# Patient Record
Sex: Female | Born: 1960 | Race: White | Hispanic: No | Marital: Married | Smoking: Former smoker
Health system: Southern US, Community
[De-identification: ages and names within clinical notes are randomized; demographics above are authoritative.]

## PROBLEM LIST (undated history)

## (undated) DIAGNOSIS — Z940 Kidney transplant status: Secondary | ICD-10-CM

## (undated) DIAGNOSIS — I10 Essential (primary) hypertension: Secondary | ICD-10-CM

## (undated) DIAGNOSIS — I420 Dilated cardiomyopathy: Secondary | ICD-10-CM

## (undated) DIAGNOSIS — E7800 Pure hypercholesterolemia, unspecified: Secondary | ICD-10-CM

## (undated) DIAGNOSIS — F419 Anxiety disorder, unspecified: Secondary | ICD-10-CM

## (undated) DIAGNOSIS — I429 Cardiomyopathy, unspecified: Secondary | ICD-10-CM

## (undated) DIAGNOSIS — J45991 Cough variant asthma: Secondary | ICD-10-CM

## (undated) DIAGNOSIS — N189 Chronic kidney disease, unspecified: Secondary | ICD-10-CM

## (undated) DIAGNOSIS — D631 Anemia in chronic kidney disease: Secondary | ICD-10-CM

## (undated) DIAGNOSIS — F329 Major depressive disorder, single episode, unspecified: Secondary | ICD-10-CM

## (undated) DIAGNOSIS — J309 Allergic rhinitis, unspecified: Secondary | ICD-10-CM

## (undated) DIAGNOSIS — J329 Chronic sinusitis, unspecified: Secondary | ICD-10-CM

## (undated) DIAGNOSIS — L719 Rosacea, unspecified: Secondary | ICD-10-CM

## (undated) DIAGNOSIS — D849 Immunodeficiency, unspecified: Secondary | ICD-10-CM

## (undated) DIAGNOSIS — N943 Premenstrual tension syndrome: Secondary | ICD-10-CM

## (undated) DIAGNOSIS — N289 Disorder of kidney and ureter, unspecified: Secondary | ICD-10-CM

## (undated) DIAGNOSIS — F32A Depression, unspecified: Secondary | ICD-10-CM

## (undated) HISTORY — DX: Allergic rhinitis, unspecified: J30.9

## (undated) HISTORY — DX: Anemia in chronic kidney disease: D63.1

## (undated) HISTORY — DX: Dilated cardiomyopathy: I42.0

## (undated) HISTORY — PX: KIDNEY TRANSPLANT: SHX239

## (undated) HISTORY — DX: Cough variant asthma: J45.991

## (undated) HISTORY — DX: Rosacea, unspecified: L71.9

## (undated) HISTORY — PX: APPENDECTOMY: SHX54

## (undated) HISTORY — DX: Cardiomyopathy, unspecified: I42.9

## (undated) HISTORY — DX: Premenstrual tension syndrome: N94.3

## (undated) HISTORY — DX: Chronic sinusitis, unspecified: J32.9

## (undated) HISTORY — DX: Kidney transplant status: Z94.0

## (undated) HISTORY — DX: Immunodeficiency, unspecified: D84.9

## (undated) HISTORY — DX: Chronic kidney disease, unspecified: N18.9

---

## 1997-09-20 ENCOUNTER — Other Ambulatory Visit: Admission: RE | Admit: 1997-09-20 | Discharge: 1997-09-20 | Payer: Self-pay | Admitting: Obstetrics and Gynecology

## 1998-05-21 ENCOUNTER — Encounter: Admission: RE | Admit: 1998-05-21 | Discharge: 1998-05-21 | Payer: Self-pay | Admitting: *Deleted

## 1998-07-20 HISTORY — PX: OTHER SURGICAL HISTORY: SHX169

## 1998-08-02 ENCOUNTER — Ambulatory Visit (HOSPITAL_COMMUNITY): Admission: RE | Admit: 1998-08-02 | Discharge: 1998-08-02 | Payer: Self-pay | Admitting: Obstetrics and Gynecology

## 1998-10-07 ENCOUNTER — Other Ambulatory Visit: Admission: RE | Admit: 1998-10-07 | Discharge: 1998-10-07 | Payer: Self-pay | Admitting: Obstetrics and Gynecology

## 1999-03-20 ENCOUNTER — Encounter: Payer: Self-pay | Admitting: Family Medicine

## 1999-03-20 ENCOUNTER — Encounter: Admission: RE | Admit: 1999-03-20 | Discharge: 1999-03-20 | Payer: Self-pay | Admitting: Family Medicine

## 1999-10-24 ENCOUNTER — Other Ambulatory Visit: Admission: RE | Admit: 1999-10-24 | Discharge: 1999-10-24 | Payer: Self-pay | Admitting: Obstetrics and Gynecology

## 2000-11-05 ENCOUNTER — Other Ambulatory Visit: Admission: RE | Admit: 2000-11-05 | Discharge: 2000-11-05 | Payer: Self-pay | Admitting: Obstetrics and Gynecology

## 2001-12-13 ENCOUNTER — Other Ambulatory Visit: Admission: RE | Admit: 2001-12-13 | Discharge: 2001-12-13 | Payer: Self-pay | Admitting: Obstetrics and Gynecology

## 2002-12-22 ENCOUNTER — Other Ambulatory Visit: Admission: RE | Admit: 2002-12-22 | Discharge: 2002-12-22 | Payer: Self-pay | Admitting: Obstetrics and Gynecology

## 2002-12-26 ENCOUNTER — Ambulatory Visit (HOSPITAL_COMMUNITY): Admission: RE | Admit: 2002-12-26 | Discharge: 2002-12-26 | Payer: Self-pay | Admitting: Obstetrics and Gynecology

## 2002-12-26 ENCOUNTER — Encounter: Payer: Self-pay | Admitting: Obstetrics and Gynecology

## 2003-12-28 ENCOUNTER — Ambulatory Visit (HOSPITAL_COMMUNITY): Admission: RE | Admit: 2003-12-28 | Discharge: 2003-12-28 | Payer: Self-pay | Admitting: Obstetrics and Gynecology

## 2005-01-23 ENCOUNTER — Ambulatory Visit (HOSPITAL_COMMUNITY): Admission: RE | Admit: 2005-01-23 | Discharge: 2005-01-23 | Payer: Self-pay | Admitting: Obstetrics and Gynecology

## 2006-01-25 ENCOUNTER — Ambulatory Visit (HOSPITAL_COMMUNITY): Admission: RE | Admit: 2006-01-25 | Discharge: 2006-01-25 | Payer: Self-pay | Admitting: Obstetrics and Gynecology

## 2006-08-08 ENCOUNTER — Inpatient Hospital Stay (HOSPITAL_COMMUNITY): Admission: EM | Admit: 2006-08-08 | Discharge: 2006-08-12 | Payer: Self-pay | Admitting: *Deleted

## 2006-09-18 ENCOUNTER — Encounter: Admission: RE | Admit: 2006-09-18 | Discharge: 2006-09-18 | Payer: Self-pay | Admitting: Gastroenterology

## 2006-10-28 ENCOUNTER — Ambulatory Visit (HOSPITAL_COMMUNITY): Admission: RE | Admit: 2006-10-28 | Discharge: 2006-10-28 | Payer: Self-pay | Admitting: Nephrology

## 2006-10-28 ENCOUNTER — Inpatient Hospital Stay (HOSPITAL_COMMUNITY): Admission: EM | Admit: 2006-10-28 | Discharge: 2006-10-31 | Payer: Self-pay | Admitting: Emergency Medicine

## 2006-10-29 ENCOUNTER — Encounter (INDEPENDENT_AMBULATORY_CARE_PROVIDER_SITE_OTHER): Payer: Self-pay | Admitting: Nephrology

## 2006-11-26 ENCOUNTER — Encounter (HOSPITAL_COMMUNITY): Admission: RE | Admit: 2006-11-26 | Discharge: 2007-02-24 | Payer: Self-pay | Admitting: Nephrology

## 2007-01-28 ENCOUNTER — Ambulatory Visit (HOSPITAL_COMMUNITY): Admission: RE | Admit: 2007-01-28 | Discharge: 2007-01-28 | Payer: Self-pay | Admitting: Obstetrics and Gynecology

## 2007-03-09 ENCOUNTER — Encounter (HOSPITAL_COMMUNITY): Admission: RE | Admit: 2007-03-09 | Discharge: 2007-06-07 | Payer: Self-pay | Admitting: Nephrology

## 2007-03-16 ENCOUNTER — Encounter: Admission: RE | Admit: 2007-03-16 | Discharge: 2007-03-16 | Payer: Self-pay | Admitting: Family Medicine

## 2007-04-09 ENCOUNTER — Emergency Department (HOSPITAL_COMMUNITY): Admission: EM | Admit: 2007-04-09 | Discharge: 2007-04-09 | Payer: Self-pay | Admitting: Emergency Medicine

## 2007-06-14 ENCOUNTER — Encounter (HOSPITAL_COMMUNITY): Admission: RE | Admit: 2007-06-14 | Discharge: 2007-09-12 | Payer: Self-pay | Admitting: Nephrology

## 2007-10-04 ENCOUNTER — Encounter (HOSPITAL_COMMUNITY): Admission: RE | Admit: 2007-10-04 | Discharge: 2008-01-02 | Payer: Self-pay | Admitting: Nephrology

## 2008-01-31 ENCOUNTER — Ambulatory Visit (HOSPITAL_COMMUNITY): Admission: RE | Admit: 2008-01-31 | Discharge: 2008-01-31 | Payer: Self-pay | Admitting: Obstetrics and Gynecology

## 2008-02-02 ENCOUNTER — Encounter (HOSPITAL_COMMUNITY): Admission: RE | Admit: 2008-02-02 | Discharge: 2008-04-27 | Payer: Self-pay | Admitting: Nephrology

## 2008-06-20 ENCOUNTER — Encounter (HOSPITAL_COMMUNITY): Admission: RE | Admit: 2008-06-20 | Discharge: 2008-09-18 | Payer: Self-pay | Admitting: Nephrology

## 2009-01-31 ENCOUNTER — Ambulatory Visit (HOSPITAL_COMMUNITY): Admission: RE | Admit: 2009-01-31 | Discharge: 2009-01-31 | Payer: Self-pay | Admitting: Obstetrics and Gynecology

## 2009-05-16 ENCOUNTER — Encounter: Admission: RE | Admit: 2009-05-16 | Discharge: 2009-05-16 | Payer: Self-pay | Admitting: Nephrology

## 2009-05-23 ENCOUNTER — Inpatient Hospital Stay (HOSPITAL_COMMUNITY): Admission: AD | Admit: 2009-05-23 | Discharge: 2009-05-28 | Payer: Self-pay | Admitting: Nephrology

## 2009-05-27 ENCOUNTER — Encounter (INDEPENDENT_AMBULATORY_CARE_PROVIDER_SITE_OTHER): Payer: Self-pay | Admitting: Nephrology

## 2009-08-02 ENCOUNTER — Observation Stay (HOSPITAL_COMMUNITY): Admission: RE | Admit: 2009-08-02 | Discharge: 2009-08-03 | Payer: Self-pay | Admitting: General Surgery

## 2009-08-19 ENCOUNTER — Inpatient Hospital Stay (HOSPITAL_COMMUNITY): Admission: EM | Admit: 2009-08-19 | Discharge: 2009-08-20 | Payer: Self-pay | Admitting: Emergency Medicine

## 2009-09-03 ENCOUNTER — Ambulatory Visit: Payer: Self-pay | Admitting: Internal Medicine

## 2009-09-03 ENCOUNTER — Ambulatory Visit: Payer: Self-pay | Admitting: Cardiology

## 2009-09-03 ENCOUNTER — Inpatient Hospital Stay (HOSPITAL_COMMUNITY): Admission: EM | Admit: 2009-09-03 | Discharge: 2009-09-12 | Payer: Self-pay | Admitting: Emergency Medicine

## 2009-09-05 ENCOUNTER — Encounter (INDEPENDENT_AMBULATORY_CARE_PROVIDER_SITE_OTHER): Payer: Self-pay | Admitting: Internal Medicine

## 2009-10-04 ENCOUNTER — Ambulatory Visit (HOSPITAL_COMMUNITY): Admission: RE | Admit: 2009-10-04 | Discharge: 2009-10-04 | Payer: Self-pay | Admitting: Cardiology

## 2009-10-25 ENCOUNTER — Ambulatory Visit (HOSPITAL_COMMUNITY): Admission: RE | Admit: 2009-10-25 | Discharge: 2009-10-25 | Payer: Self-pay | Admitting: Nephrology

## 2009-10-31 ENCOUNTER — Encounter: Admission: RE | Admit: 2009-10-31 | Discharge: 2009-10-31 | Payer: Self-pay | Admitting: Nephrology

## 2009-11-01 ENCOUNTER — Ambulatory Visit (HOSPITAL_COMMUNITY): Admission: RE | Admit: 2009-11-01 | Discharge: 2009-11-01 | Payer: Self-pay | Admitting: Nephrology

## 2009-11-04 ENCOUNTER — Ambulatory Visit (HOSPITAL_COMMUNITY): Admission: RE | Admit: 2009-11-04 | Discharge: 2009-11-05 | Payer: Self-pay | Admitting: General Surgery

## 2009-11-18 DEATH — deceased

## 2009-12-11 ENCOUNTER — Ambulatory Visit (HOSPITAL_COMMUNITY): Admission: RE | Admit: 2009-12-11 | Discharge: 2009-12-11 | Payer: Self-pay | Admitting: Nephrology

## 2010-01-30 ENCOUNTER — Ambulatory Visit (HOSPITAL_COMMUNITY)
Admission: RE | Admit: 2010-01-30 | Discharge: 2010-01-30 | Payer: Self-pay | Source: Home / Self Care | Admitting: Obstetrics and Gynecology

## 2010-04-26 ENCOUNTER — Emergency Department (HOSPITAL_COMMUNITY)
Admission: EM | Admit: 2010-04-26 | Discharge: 2010-04-26 | Payer: Self-pay | Source: Home / Self Care | Admitting: Emergency Medicine

## 2010-05-05 LAB — POCT I-STAT, CHEM 8
BUN: 79 mg/dL — ABNORMAL HIGH (ref 6–23)
Calcium, Ion: 1.05 mmol/L — ABNORMAL LOW (ref 1.12–1.32)
Chloride: 108 mEq/L (ref 96–112)
Creatinine, Ser: 11.9 mg/dL — ABNORMAL HIGH (ref 0.4–1.2)
Glucose, Bld: 74 mg/dL (ref 70–99)
HCT: 38 % (ref 36.0–46.0)
Hemoglobin: 12.9 g/dL (ref 12.0–15.0)
Potassium: 4.9 mEq/L (ref 3.5–5.1)
Sodium: 136 mEq/L (ref 135–145)
TCO2: 25 mmol/L (ref 0–100)

## 2010-05-05 LAB — POTASSIUM: Potassium: 5 mEq/L (ref 3.5–5.1)

## 2010-06-24 ENCOUNTER — Encounter (HOSPITAL_COMMUNITY)
Admission: RE | Admit: 2010-06-24 | Discharge: 2010-06-24 | Disposition: A | Discharge status: Home / Self Care | Payer: 59 | Source: Ambulatory Visit | Attending: Orthopedic Surgery | Admitting: Orthopedic Surgery

## 2010-06-24 LAB — CBC
HCT: 38.6 % (ref 36.0–46.0)
Hemoglobin: 11.8 g/dL — ABNORMAL LOW (ref 12.0–15.0)
MCV: 95.1 fL (ref 78.0–100.0)
Platelets: 436 10*3/uL — ABNORMAL HIGH (ref 150–400)

## 2010-06-24 LAB — BASIC METABOLIC PANEL
BUN: 61 mg/dL — ABNORMAL HIGH (ref 6–23)
Calcium: 9.6 mg/dL (ref 8.4–10.5)
Creatinine, Ser: 12.05 mg/dL — ABNORMAL HIGH (ref 0.4–1.2)
GFR calc Af Amer: 4 mL/min — ABNORMAL LOW (ref >60)
Sodium: 140 mEq/L (ref 135–145)

## 2010-06-24 LAB — PROTIME-INR: Prothrombin Time: 11.8 seconds (ref 11.6–15.2)

## 2010-06-24 LAB — SURGICAL PCR SCREEN: MRSA, PCR: NEGATIVE

## 2010-06-26 ENCOUNTER — Other Ambulatory Visit: Payer: Self-pay | Admitting: Orthopedic Surgery

## 2010-06-26 ENCOUNTER — Ambulatory Visit (HOSPITAL_COMMUNITY)
Admission: RE | Admit: 2010-06-26 | Discharge: 2010-06-26 | Disposition: A | Discharge status: Home / Self Care | Payer: 59 | Source: Ambulatory Visit | Attending: Orthopedic Surgery | Admitting: Orthopedic Surgery

## 2010-06-26 DIAGNOSIS — Z01818 Encounter for other preprocedural examination: Secondary | ICD-10-CM | POA: Insufficient documentation

## 2010-06-26 DIAGNOSIS — R52 Pain, unspecified: Secondary | ICD-10-CM

## 2010-06-26 DIAGNOSIS — Z01812 Encounter for preprocedural laboratory examination: Secondary | ICD-10-CM | POA: Insufficient documentation

## 2010-06-26 DIAGNOSIS — G5600 Carpal tunnel syndrome, unspecified upper limb: Secondary | ICD-10-CM | POA: Insufficient documentation

## 2010-06-26 LAB — POCT I-STAT 4, (NA,K, GLUC, HGB,HCT)
Glucose, Bld: 84 mg/dL (ref 70–99)
HCT: 37 % (ref 36.0–46.0)
Potassium: 5 mEq/L (ref 3.5–5.1)

## 2010-07-05 LAB — CBC
HCT: 35.6 % — ABNORMAL LOW (ref 36.0–46.0)
HCT: 44.2 % (ref 36.0–46.0)
Hemoglobin: 11.8 g/dL — ABNORMAL LOW (ref 12.0–15.0)
MCH: 31.1 pg (ref 26.0–34.0)
MCH: 31.3 pg (ref 26.0–34.0)
MCH: 31.3 pg (ref 26.0–34.0)
MCHC: 33 g/dL (ref 30.0–36.0)
MCV: 94.6 fL (ref 78.0–100.0)
MCV: 94.9 fL (ref 78.0–100.0)
MCV: 95.1 fL (ref 78.0–100.0)
Platelets: 302 10*3/uL (ref 150–400)
Platelets: 354 10*3/uL (ref 150–400)
RBC: 4.66 MIL/uL (ref 3.87–5.11)
RDW: 16.7 % — ABNORMAL HIGH (ref 11.5–15.5)

## 2010-07-05 LAB — RENAL FUNCTION PANEL
BUN: 62 mg/dL — ABNORMAL HIGH (ref 6–23)
CO2: 18 mEq/L — ABNORMAL LOW (ref 19–32)
Chloride: 108 mEq/L (ref 96–112)
Creatinine, Ser: 10.84 mg/dL — ABNORMAL HIGH (ref 0.4–1.2)
Glucose, Bld: 58 mg/dL — ABNORMAL LOW (ref 70–99)

## 2010-07-05 LAB — BASIC METABOLIC PANEL
BUN: 65 mg/dL — ABNORMAL HIGH (ref 6–23)
CO2: 23 mEq/L (ref 19–32)
Chloride: 98 mEq/L (ref 96–112)
Creatinine, Ser: 11.79 mg/dL — ABNORMAL HIGH (ref 0.4–1.2)
GFR calc Af Amer: 3 mL/min — ABNORMAL LOW (ref >60)
GFR calc Af Amer: 4 mL/min — ABNORMAL LOW (ref >60)
GFR calc non Af Amer: 3 mL/min — ABNORMAL LOW (ref >60)

## 2010-07-05 LAB — SURGICAL PCR SCREEN: Staphylococcus aureus: NEGATIVE

## 2010-07-05 LAB — DIFFERENTIAL
Eosinophils Absolute: 0.4 10*3/uL (ref 0.0–0.7)
Eosinophils Relative: 8 % — ABNORMAL HIGH (ref 0–5)
Lymphs Abs: 1 10*3/uL (ref 0.7–4.0)
Monocytes Absolute: 1 10*3/uL (ref 0.1–1.0)

## 2010-07-05 LAB — HEPATITIS B SURFACE ANTIGEN: Hepatitis B Surface Ag: NEGATIVE

## 2010-07-07 LAB — RENAL FUNCTION PANEL
Albumin: 2.4 g/dL — ABNORMAL LOW (ref 3.5–5.2)
Albumin: 2.5 g/dL — ABNORMAL LOW (ref 3.5–5.2)
Albumin: 2.9 g/dL — ABNORMAL LOW (ref 3.5–5.2)
Albumin: 2.9 g/dL — ABNORMAL LOW (ref 3.5–5.2)
BUN: 15 mg/dL (ref 6–23)
BUN: 46 mg/dL — ABNORMAL HIGH (ref 6–23)
BUN: 52 mg/dL — ABNORMAL HIGH (ref 6–23)
CO2: 24 mEq/L (ref 19–32)
CO2: 25 mEq/L (ref 19–32)
Calcium: 8.2 mg/dL — ABNORMAL LOW (ref 8.4–10.5)
Calcium: 8.8 mg/dL (ref 8.4–10.5)
Calcium: 8.9 mg/dL (ref 8.4–10.5)
Calcium: 9.3 mg/dL (ref 8.4–10.5)
Calcium: 9.8 mg/dL (ref 8.4–10.5)
Chloride: 100 mEq/L (ref 96–112)
Chloride: 94 mEq/L — ABNORMAL LOW (ref 96–112)
Chloride: 96 mEq/L (ref 96–112)
Creatinine, Ser: 4.3 mg/dL — ABNORMAL HIGH (ref 0.4–1.2)
Creatinine, Ser: 4.55 mg/dL — ABNORMAL HIGH (ref 0.4–1.2)
Creatinine, Ser: 5.69 mg/dL — ABNORMAL HIGH (ref 0.4–1.2)
Creatinine, Ser: 5.7 mg/dL — ABNORMAL HIGH (ref 0.4–1.2)
Creatinine, Ser: 7.05 mg/dL — ABNORMAL HIGH (ref 0.4–1.2)
Creatinine, Ser: 9.33 mg/dL — ABNORMAL HIGH (ref 0.4–1.2)
GFR calc Af Amer: 10 mL/min — ABNORMAL LOW (ref >60)
GFR calc Af Amer: 13 mL/min — ABNORMAL LOW (ref >60)
GFR calc Af Amer: 13 mL/min — ABNORMAL LOW (ref >60)
GFR calc Af Amer: 5 mL/min — ABNORMAL LOW (ref >60)
GFR calc non Af Amer: 10 mL/min — ABNORMAL LOW (ref >60)
GFR calc non Af Amer: 11 mL/min — ABNORMAL LOW (ref >60)
GFR calc non Af Amer: 5 mL/min — ABNORMAL LOW (ref >60)
GFR calc non Af Amer: 8 mL/min — ABNORMAL LOW (ref >60)
Glucose, Bld: 141 mg/dL — ABNORMAL HIGH (ref 70–99)
Glucose, Bld: 167 mg/dL — ABNORMAL HIGH (ref 70–99)
Glucose, Bld: 95 mg/dL (ref 70–99)
Glucose, Bld: 98 mg/dL (ref 70–99)
Phosphorus: 3.1 mg/dL (ref 2.3–4.6)
Phosphorus: 3.3 mg/dL (ref 2.3–4.6)
Phosphorus: 3.5 mg/dL (ref 2.3–4.6)
Phosphorus: 6 mg/dL — ABNORMAL HIGH (ref 2.3–4.6)
Phosphorus: 7.1 mg/dL — ABNORMAL HIGH (ref 2.3–4.6)
Potassium: 6.3 mEq/L (ref 3.5–5.1)
Sodium: 135 mEq/L (ref 135–145)

## 2010-07-07 LAB — CBC
HCT: 21.7 % — ABNORMAL LOW (ref 36.0–46.0)
HCT: 22.8 % — ABNORMAL LOW (ref 36.0–46.0)
HCT: 25.2 % — ABNORMAL LOW (ref 36.0–46.0)
HCT: 26.1 % — ABNORMAL LOW (ref 36.0–46.0)
HCT: 27.7 % — ABNORMAL LOW (ref 36.0–46.0)
HCT: 30 % — ABNORMAL LOW (ref 36.0–46.0)
Hemoglobin: 10 g/dL — ABNORMAL LOW (ref 12.0–15.0)
Hemoglobin: 7.6 g/dL — ABNORMAL LOW (ref 12.0–15.0)
Hemoglobin: 8.6 g/dL — ABNORMAL LOW (ref 12.0–15.0)
Hemoglobin: 8.7 g/dL — ABNORMAL LOW (ref 12.0–15.0)
Hemoglobin: 8.9 g/dL — ABNORMAL LOW (ref 12.0–15.0)
Hemoglobin: 9.8 g/dL — ABNORMAL LOW (ref 12.0–15.0)
MCHC: 33.5 g/dL (ref 30.0–36.0)
MCHC: 33.6 g/dL (ref 30.0–36.0)
MCHC: 34.2 g/dL (ref 30.0–36.0)
MCHC: 34.4 g/dL (ref 30.0–36.0)
MCHC: 34.4 g/dL (ref 30.0–36.0)
MCHC: 34.7 g/dL (ref 30.0–36.0)
MCHC: 35 g/dL (ref 30.0–36.0)
MCV: 96.3 fL (ref 78.0–100.0)
MCV: 96.9 fL (ref 78.0–100.0)
MCV: 96.9 fL (ref 78.0–100.0)
MCV: 97.3 fL (ref 78.0–100.0)
MCV: 97.6 fL (ref 78.0–100.0)
MCV: 97.8 fL (ref 78.0–100.0)
Platelets: 393 10*3/uL (ref 150–400)
Platelets: 397 10*3/uL (ref 150–400)
Platelets: 423 10*3/uL — ABNORMAL HIGH (ref 150–400)
Platelets: 426 10*3/uL — ABNORMAL HIGH (ref 150–400)
Platelets: 432 10*3/uL — ABNORMAL HIGH (ref 150–400)
Platelets: 439 10*3/uL — ABNORMAL HIGH (ref 150–400)
RBC: 2.44 MIL/uL — ABNORMAL LOW (ref 3.87–5.11)
RBC: 2.97 MIL/uL — ABNORMAL LOW (ref 3.87–5.11)
RBC: 3.04 MIL/uL — ABNORMAL LOW (ref 3.87–5.11)
RBC: 3.1 MIL/uL — ABNORMAL LOW (ref 3.87–5.11)
RDW: 16.8 % — ABNORMAL HIGH (ref 11.5–15.5)
RDW: 16.9 % — ABNORMAL HIGH (ref 11.5–15.5)
RDW: 16.9 % — ABNORMAL HIGH (ref 11.5–15.5)
RDW: 17.2 % — ABNORMAL HIGH (ref 11.5–15.5)
RDW: 17.2 % — ABNORMAL HIGH (ref 11.5–15.5)
RDW: 17.3 % — ABNORMAL HIGH (ref 11.5–15.5)
RDW: 17.6 % — ABNORMAL HIGH (ref 11.5–15.5)
RDW: 17.6 % — ABNORMAL HIGH (ref 11.5–15.5)
WBC: 3.3 10*3/uL — ABNORMAL LOW (ref 4.0–10.5)
WBC: 3.5 10*3/uL — ABNORMAL LOW (ref 4.0–10.5)
WBC: 4.9 10*3/uL (ref 4.0–10.5)
WBC: 5.6 10*3/uL (ref 4.0–10.5)
WBC: 6.4 10*3/uL (ref 4.0–10.5)

## 2010-07-07 LAB — BASIC METABOLIC PANEL
BUN: 23 mg/dL (ref 6–23)
CO2: 24 mEq/L (ref 19–32)
Calcium: 8.4 mg/dL (ref 8.4–10.5)
Chloride: 101 mEq/L (ref 96–112)
Creatinine, Ser: 6.89 mg/dL — ABNORMAL HIGH (ref 0.4–1.2)
GFR calc Af Amer: 9 mL/min — ABNORMAL LOW (ref >60)
GFR calc non Af Amer: 8 mL/min — ABNORMAL LOW (ref >60)
Glucose, Bld: 91 mg/dL (ref 70–99)
Potassium: 4.3 mEq/L (ref 3.5–5.1)
Sodium: 131 mEq/L — ABNORMAL LOW (ref 135–145)
Sodium: 135 mEq/L (ref 135–145)

## 2010-07-07 LAB — COMPREHENSIVE METABOLIC PANEL
ALT: 18 U/L (ref 0–35)
AST: 31 U/L (ref 0–37)
Albumin: 2.4 g/dL — ABNORMAL LOW (ref 3.5–5.2)
Albumin: 2.4 g/dL — ABNORMAL LOW (ref 3.5–5.2)
BUN: 10 mg/dL (ref 6–23)
BUN: 25 mg/dL — ABNORMAL HIGH (ref 6–23)
CO2: 25 mEq/L (ref 19–32)
Calcium: 12.4 mg/dL — ABNORMAL HIGH (ref 8.4–10.5)
Calcium: 8.1 mg/dL — ABNORMAL LOW (ref 8.4–10.5)
Calcium: 8.7 mg/dL (ref 8.4–10.5)
Chloride: 101 mEq/L (ref 96–112)
Chloride: 89 mEq/L — ABNORMAL LOW (ref 96–112)
Creatinine, Ser: 15.95 mg/dL — ABNORMAL HIGH (ref 0.4–1.2)
Creatinine, Ser: 3.46 mg/dL — ABNORMAL HIGH (ref 0.4–1.2)
Creatinine, Ser: 5.45 mg/dL — ABNORMAL HIGH (ref 0.4–1.2)
GFR calc Af Amer: 17 mL/min — ABNORMAL LOW (ref >60)
GFR calc non Af Amer: 2 mL/min — ABNORMAL LOW (ref >60)
Glucose, Bld: 103 mg/dL — ABNORMAL HIGH (ref 70–99)
Glucose, Bld: 85 mg/dL (ref 70–99)
Total Bilirubin: 0.9 mg/dL (ref 0.3–1.2)
Total Protein: 4.8 g/dL — ABNORMAL LOW (ref 6.0–8.3)
Total Protein: 4.9 g/dL — ABNORMAL LOW (ref 6.0–8.3)

## 2010-07-07 LAB — DIFFERENTIAL
Basophils Absolute: 0 10*3/uL (ref 0.0–0.1)
Basophils Absolute: 0 10*3/uL (ref 0.0–0.1)
Basophils Absolute: 0 10*3/uL (ref 0.0–0.1)
Basophils Relative: 1 % (ref 0–1)
Eosinophils Relative: 1 % (ref 0–5)
Eosinophils Relative: 14 % — ABNORMAL HIGH (ref 0–5)
Lymphocytes Relative: 13 % (ref 12–46)
Lymphocytes Relative: 18 % (ref 12–46)
Lymphs Abs: 0.6 10*3/uL — ABNORMAL LOW (ref 0.7–4.0)
Lymphs Abs: 0.7 10*3/uL (ref 0.7–4.0)
Lymphs Abs: 0.8 10*3/uL (ref 0.7–4.0)
Monocytes Absolute: 0.9 10*3/uL (ref 0.1–1.0)
Monocytes Absolute: 1.5 10*3/uL — ABNORMAL HIGH (ref 0.1–1.0)
Monocytes Relative: 13 % — ABNORMAL HIGH (ref 3–12)
Monocytes Relative: 26 % — ABNORMAL HIGH (ref 3–12)
Monocytes Relative: 33 % — ABNORMAL HIGH (ref 3–12)
Neutro Abs: 1.2 10*3/uL — ABNORMAL LOW (ref 1.7–7.7)
Neutro Abs: 4.5 10*3/uL (ref 1.7–7.7)
Neutrophils Relative %: 60 % (ref 43–77)

## 2010-07-07 LAB — BLOOD GAS, ARTERIAL
Acid-base deficit: 3.4 mmol/L — ABNORMAL HIGH (ref 0.0–2.0)
FIO2: 0.5 %
Patient temperature: 98.6
TCO2: 20.7 mmol/L (ref 0–100)
pH, Arterial: 7.46 — ABNORMAL HIGH (ref 7.350–7.400)

## 2010-07-07 LAB — CARDIAC PANEL(CRET KIN+CKTOT+MB+TROPI)
CK, MB: 4.8 ng/mL — ABNORMAL HIGH (ref 0.3–4.0)
Relative Index: INVALID (ref 0.0–2.5)
Relative Index: INVALID (ref 0.0–2.5)
Total CK: 54 U/L (ref 7–177)
Troponin I: 0.54 ng/mL (ref 0.00–0.06)
Troponin I: 0.64 ng/mL (ref 0.00–0.06)

## 2010-07-07 LAB — CULTURE, BLOOD (ROUTINE X 2): Culture: NO GROWTH

## 2010-07-07 LAB — MRSA PCR SCREENING
MRSA by PCR: NEGATIVE
MRSA by PCR: NEGATIVE

## 2010-07-07 LAB — RETICULOCYTES
RBC.: 2.39 MIL/uL — ABNORMAL LOW (ref 3.87–5.11)
Retic Count, Absolute: 86 10*3/uL (ref 19.0–186.0)
Retic Ct Pct: 3.6 % — ABNORMAL HIGH (ref 0.4–3.1)

## 2010-07-07 LAB — FERRITIN: Ferritin: 460 ng/mL — ABNORMAL HIGH (ref 10–291)

## 2010-07-07 LAB — PHOSPHORUS
Phosphorus: 2 mg/dL — ABNORMAL LOW (ref 2.3–4.6)
Phosphorus: 4 mg/dL (ref 2.3–4.6)

## 2010-07-07 LAB — POCT I-STAT 3, ART BLOOD GAS (G3+)
Acid-Base Excess: 4 mmol/L — ABNORMAL HIGH (ref 0.0–2.0)
Bicarbonate: 18.7 mEq/L — ABNORMAL LOW (ref 20.0–24.0)
Bicarbonate: 27.2 mEq/L — ABNORMAL HIGH (ref 20.0–24.0)
O2 Saturation: 90 %
Patient temperature: 100.2
Patient temperature: 98.9
TCO2: 20 mmol/L (ref 0–100)
pH, Arterial: 7.375 (ref 7.350–7.400)
pH, Arterial: 7.47 — ABNORMAL HIGH (ref 7.350–7.400)

## 2010-07-07 LAB — LACTIC ACID, PLASMA: Lactic Acid, Venous: 2 mmol/L (ref 0.5–2.2)

## 2010-07-07 LAB — CROSSMATCH: ABO/RH(D): O POS

## 2010-07-07 LAB — VITAMIN B12: Vitamin B-12: >2000 pg/mL — ABNORMAL HIGH (ref 211–911)

## 2010-07-07 LAB — CORTISOL: Cortisol, Plasma: 27.1 ug/dL

## 2010-07-07 LAB — GLUCOSE, CAPILLARY

## 2010-07-07 LAB — MAGNESIUM
Magnesium: 2 mg/dL (ref 1.5–2.5)
Magnesium: 2.1 mg/dL (ref 1.5–2.5)

## 2010-07-07 LAB — HEPATITIS B SURFACE ANTIGEN: Hepatitis B Surface Ag: NEGATIVE

## 2010-07-07 LAB — ANA: Anti Nuclear Antibody(ANA): NEGATIVE

## 2010-07-07 LAB — PROCALCITONIN: Procalcitonin: 3.51 ng/mL

## 2010-07-07 LAB — IRON AND TIBC
Iron: 55 ug/dL (ref 42–135)
UIBC: 134 ug/dL

## 2010-07-07 LAB — BRAIN NATRIURETIC PEPTIDE: Pro B Natriuretic peptide (BNP): 2039 pg/mL — ABNORMAL HIGH (ref 0.0–100.0)

## 2010-07-07 LAB — VITAMIN B1: Vitamin B1 (Thiamine): 66 nmol/L — ABNORMAL HIGH (ref 9–44)

## 2010-07-08 LAB — CULTURE, BLOOD (ROUTINE X 2)

## 2010-07-08 LAB — RENAL FUNCTION PANEL
BUN: 56 mg/dL — ABNORMAL HIGH (ref 6–23)
CO2: 27 mEq/L (ref 19–32)
Chloride: 97 mEq/L (ref 96–112)
Creatinine, Ser: 6.09 mg/dL — ABNORMAL HIGH (ref 0.4–1.2)
Glucose, Bld: 95 mg/dL (ref 70–99)

## 2010-07-08 LAB — CBC
HCT: 31.4 % — ABNORMAL LOW (ref 36.0–46.0)
HCT: 38 % (ref 36.0–46.0)
Hemoglobin: 10.5 g/dL — ABNORMAL LOW (ref 12.0–15.0)
MCV: 101.2 fL — ABNORMAL HIGH (ref 78.0–100.0)
Platelets: 237 10*3/uL (ref 150–400)
WBC: 5.4 10*3/uL (ref 4.0–10.5)
WBC: 8.6 10*3/uL (ref 4.0–10.5)

## 2010-07-08 LAB — BASIC METABOLIC PANEL
BUN: 81 mg/dL — ABNORMAL HIGH (ref 6–23)
Calcium: 8.7 mg/dL (ref 8.4–10.5)
Chloride: 96 mEq/L (ref 96–112)
GFR calc Af Amer: 7 mL/min — ABNORMAL LOW (ref >60)
GFR calc non Af Amer: 6 mL/min — ABNORMAL LOW (ref >60)
GFR calc non Af Amer: 4 mL/min — ABNORMAL LOW (ref >60)
GFR calc non Af Amer: 6 mL/min — ABNORMAL LOW (ref >60)
Glucose, Bld: 103 mg/dL — ABNORMAL HIGH (ref 70–99)
Potassium: 4.1 mEq/L (ref 3.5–5.1)
Potassium: 5.4 mEq/L — ABNORMAL HIGH (ref 3.5–5.1)
Sodium: 136 mEq/L (ref 135–145)
Sodium: 137 mEq/L (ref 135–145)
Sodium: 137 mEq/L (ref 135–145)

## 2010-07-08 LAB — DIFFERENTIAL
Basophils Relative: 1 % (ref 0–1)
Eosinophils Relative: 6 % — ABNORMAL HIGH (ref 0–5)
Lymphs Abs: 0.9 10*3/uL (ref 0.7–4.0)
Monocytes Relative: 23 % — ABNORMAL HIGH (ref 3–12)
Neutro Abs: 2.9 10*3/uL (ref 1.7–7.7)

## 2010-07-08 LAB — POCT I-STAT 4, (NA,K, GLUC, HGB,HCT)
Glucose, Bld: 87 mg/dL (ref 70–99)
HCT: 44 % (ref 36.0–46.0)

## 2010-07-09 LAB — BASIC METABOLIC PANEL
BUN: 80 mg/dL — ABNORMAL HIGH (ref 6–23)
CO2: 31 mEq/L (ref 19–32)
Calcium: 7.6 mg/dL — ABNORMAL LOW (ref 8.4–10.5)
Calcium: 8.2 mg/dL — ABNORMAL LOW (ref 8.4–10.5)
Chloride: 100 mEq/L (ref 96–112)
Chloride: 100 mEq/L (ref 96–112)
Creatinine, Ser: 13.31 mg/dL — ABNORMAL HIGH (ref 0.4–1.2)
GFR calc Af Amer: 4 mL/min — ABNORMAL LOW (ref >60)
GFR calc Af Amer: 9 mL/min — ABNORMAL LOW (ref >60)
GFR calc non Af Amer: 3 mL/min — ABNORMAL LOW (ref >60)
Glucose, Bld: 163 mg/dL — ABNORMAL HIGH (ref 70–99)
Potassium: 5.8 mEq/L — ABNORMAL HIGH (ref 3.5–5.1)
Sodium: 137 mEq/L (ref 135–145)
Sodium: 133 mEq/L — ABNORMAL LOW (ref 135–145)
Sodium: 140 mEq/L (ref 135–145)

## 2010-07-09 LAB — RENAL FUNCTION PANEL
Albumin: 3.1 g/dL — ABNORMAL LOW (ref 3.5–5.2)
BUN: 97 mg/dL — ABNORMAL HIGH (ref 6–23)
CO2: 23 mEq/L (ref 19–32)
CO2: 25 mEq/L (ref 19–32)
Calcium: 7.8 mg/dL — ABNORMAL LOW (ref 8.4–10.5)
Calcium: 8.3 mg/dL — ABNORMAL LOW (ref 8.4–10.5)
Creatinine, Ser: 11.5 mg/dL — ABNORMAL HIGH (ref 0.4–1.2)
Creatinine, Ser: 7.72 mg/dL — ABNORMAL HIGH (ref 0.4–1.2)
GFR calc Af Amer: 7 mL/min — ABNORMAL LOW (ref >60)
GFR calc non Af Amer: 4 mL/min — ABNORMAL LOW (ref >60)
GFR calc non Af Amer: 6 mL/min — ABNORMAL LOW (ref >60)
Glucose, Bld: 146 mg/dL — ABNORMAL HIGH (ref 70–99)
Glucose, Bld: 162 mg/dL — ABNORMAL HIGH (ref 70–99)
Phosphorus: 8 mg/dL — ABNORMAL HIGH (ref 2.3–4.6)
Phosphorus: 9 mg/dL — ABNORMAL HIGH (ref 2.3–4.6)
Potassium: 4.4 mEq/L (ref 3.5–5.1)
Potassium: 4.4 mEq/L (ref 3.5–5.1)
Sodium: 133 mEq/L — ABNORMAL LOW (ref 135–145)
Sodium: 137 mEq/L (ref 135–145)

## 2010-07-09 LAB — DIFFERENTIAL
Eosinophils Absolute: 0 10*3/uL (ref 0.0–0.7)
Eosinophils Relative: 0 % (ref 0–5)
Lymphs Abs: 0.5 10*3/uL — ABNORMAL LOW (ref 0.7–4.0)
Monocytes Absolute: 0.7 10*3/uL (ref 0.1–1.0)
Monocytes Relative: 10 % (ref 3–12)

## 2010-07-09 LAB — CBC
HCT: 41.5 % (ref 36.0–46.0)
HCT: 24.3 % — ABNORMAL LOW (ref 36.0–46.0)
Hemoglobin: 13.6 g/dL (ref 12.0–15.0)
Hemoglobin: 6.9 g/dL — CL (ref 12.0–15.0)
Hemoglobin: 7.5 g/dL — ABNORMAL LOW (ref 12.0–15.0)
Hemoglobin: 8.5 g/dL — ABNORMAL LOW (ref 12.0–15.0)
Hemoglobin: 9.2 g/dL — ABNORMAL LOW (ref 12.0–15.0)
MCHC: 34.8 g/dL (ref 30.0–36.0)
MCHC: 35.2 g/dL (ref 30.0–36.0)
MCHC: 35.6 g/dL (ref 30.0–36.0)
MCHC: 35.7 g/dL (ref 30.0–36.0)
MCV: 108.4 fL — ABNORMAL HIGH (ref 78.0–100.0)
MCV: 92.4 fL (ref 78.0–100.0)
MCV: 93.2 fL (ref 78.0–100.0)
Platelets: 278 10*3/uL (ref 150–400)
Platelets: 294 10*3/uL (ref 150–400)
Platelets: 305 10*3/uL (ref 150–400)
Platelets: 349 10*3/uL (ref 150–400)
RBC: 3.83 MIL/uL — ABNORMAL LOW (ref 3.87–5.11)
RBC: 2.06 MIL/uL — ABNORMAL LOW (ref 3.87–5.11)
RBC: 2.28 MIL/uL — ABNORMAL LOW (ref 3.87–5.11)
RBC: 2.81 MIL/uL — ABNORMAL LOW (ref 3.87–5.11)
RDW: 13.2 % (ref 11.5–15.5)
RDW: 13.3 % (ref 11.5–15.5)
RDW: 13.6 % (ref 11.5–15.5)
WBC: 7.4 10*3/uL (ref 4.0–10.5)
WBC: 10.5 10*3/uL (ref 4.0–10.5)
WBC: 8.9 10*3/uL (ref 4.0–10.5)
WBC: 9.2 10*3/uL (ref 4.0–10.5)

## 2010-07-09 LAB — PROTIME-INR
INR: 0.97 (ref 0.00–1.49)
Prothrombin Time: 12.8 seconds (ref 11.6–15.2)

## 2010-07-09 LAB — CROSSMATCH: Antibody Screen: NEGATIVE

## 2010-07-09 LAB — URINALYSIS, ROUTINE W REFLEX MICROSCOPIC
Glucose, UA: NEGATIVE mg/dL
Hgb urine dipstick: NEGATIVE
Ketones, ur: NEGATIVE mg/dL
Protein, ur: 100 mg/dL — AB
pH: 6 (ref 5.0–8.0)

## 2010-07-09 LAB — HEMOGLOBIN AND HEMATOCRIT, BLOOD: HCT: 23.3 % — ABNORMAL LOW (ref 36.0–46.0)

## 2010-07-09 LAB — HEPATITIS B SURFACE ANTIBODY,QUALITATIVE: Hep B S Ab: NEGATIVE

## 2010-07-09 LAB — APTT: aPTT: 27 seconds (ref 24–37)

## 2010-07-09 LAB — HEPATITIS B SURFACE ANTIGEN: Hepatitis B Surface Ag: NEGATIVE

## 2010-07-09 LAB — URINE MICROSCOPIC-ADD ON

## 2010-07-17 NOTE — Op Note (Signed)
  NAMEKARRISSA, PARCHMENT                 ACCOUNT NO.:  [id number]  MEDICAL RECORD NO.:  [id number]           PATIENT TYPE:  O  LOCATION:  XRAY                         FACILITY:  [facility]  PHYSICIAN:  Burnard Bunting, M.D.    DATE OF BIRTH:  [birth year]  DATE OF PROCEDURE:  06/26/2010 DATE OF DISCHARGE:  06/26/2010                              OPERATIVE REPORT   PREOPERATIVE DIAGNOSIS:  Right carpal tunnel syndrome.  POSTOPERATIVE DIAGNOSIS:  Right carpal tunnel syndrome.  PROCEDURE:  Right carpal tunnel release.  SURGEON:  Burnard Bunting, MD  ASSISTANT:  None.  ANESTHESIA:  General endotracheal.  ESTIMATED BLOOD LOSS:  Minimal.  TOURNIQUET TIME:  Nine minutes at 250 mmHg.  INDICATIONS:  Aryannah Mohon is a patient with end-stage right carpal tunnel syndrome with night pain, rest pain, pain has prevents her from doing her activities of daily living.  She presents now for operative management after explanation of risks and benefits.  PROCEDURE IN DETAIL:  The patient was brought to the operating room where general anesthesia was induced.  Preoperative antibiotics were administered.  Right hand was prescrubbed with alcohol and Betadine and then prepped with DuraPrep and draped in a sterile manner.  Time-out was called.  An incision was made at the intersection of Kaplan's cardinal line on the radial border of the third finger and taken down to the proximal wrist flexion crease.  Skin and subcutaneous tissues were sharply divided and palmar fascia was divided.  Transcarpal ligament was visualized and divided in its midsection longitudinally for 2-3 mm. Right angle retractor was then placed between the transverse carpal ligament and the median nerve, which was then divided out distally under direct visualization and proximally to the forearm fascia under direct visualization.  At this time, motor branches were inspected and found to be intact.  Tourniquet was released.  Bleeding points were  encountered and controlled with bipolar electrocautery.  Skin edges were anesthetized using Marcaine solution.  Skin edges were then closed using simple 3-0 nylon sutures.  A well-padded splint which was functional was then applied. The patient tolerated the procedure well without immediate complication.     Burnard Bunting, M.D.     GSD/MEDQ  D:  06/26/2010  T:  06/27/2010  Job:  [id number]  Electronically Signed by Reece Agar.  DEAN M.D. on 07/17/2010 08:57:51 AM

## 2010-07-26 IMAGING — CR DG CHEST 1V PORT
1 series · 1 of 1 positions shown · non-contrast
Comparison: Previous study of 09/06/2009 at 1448 hours.  Current
study was performed at 6373 hours.

CLINICAL DATA: History of pulmonary edema and end-stage renal
failure.  History of follow-up after hemodialysis.

PORTABLE CHEST - 1 VIEW

[AP]
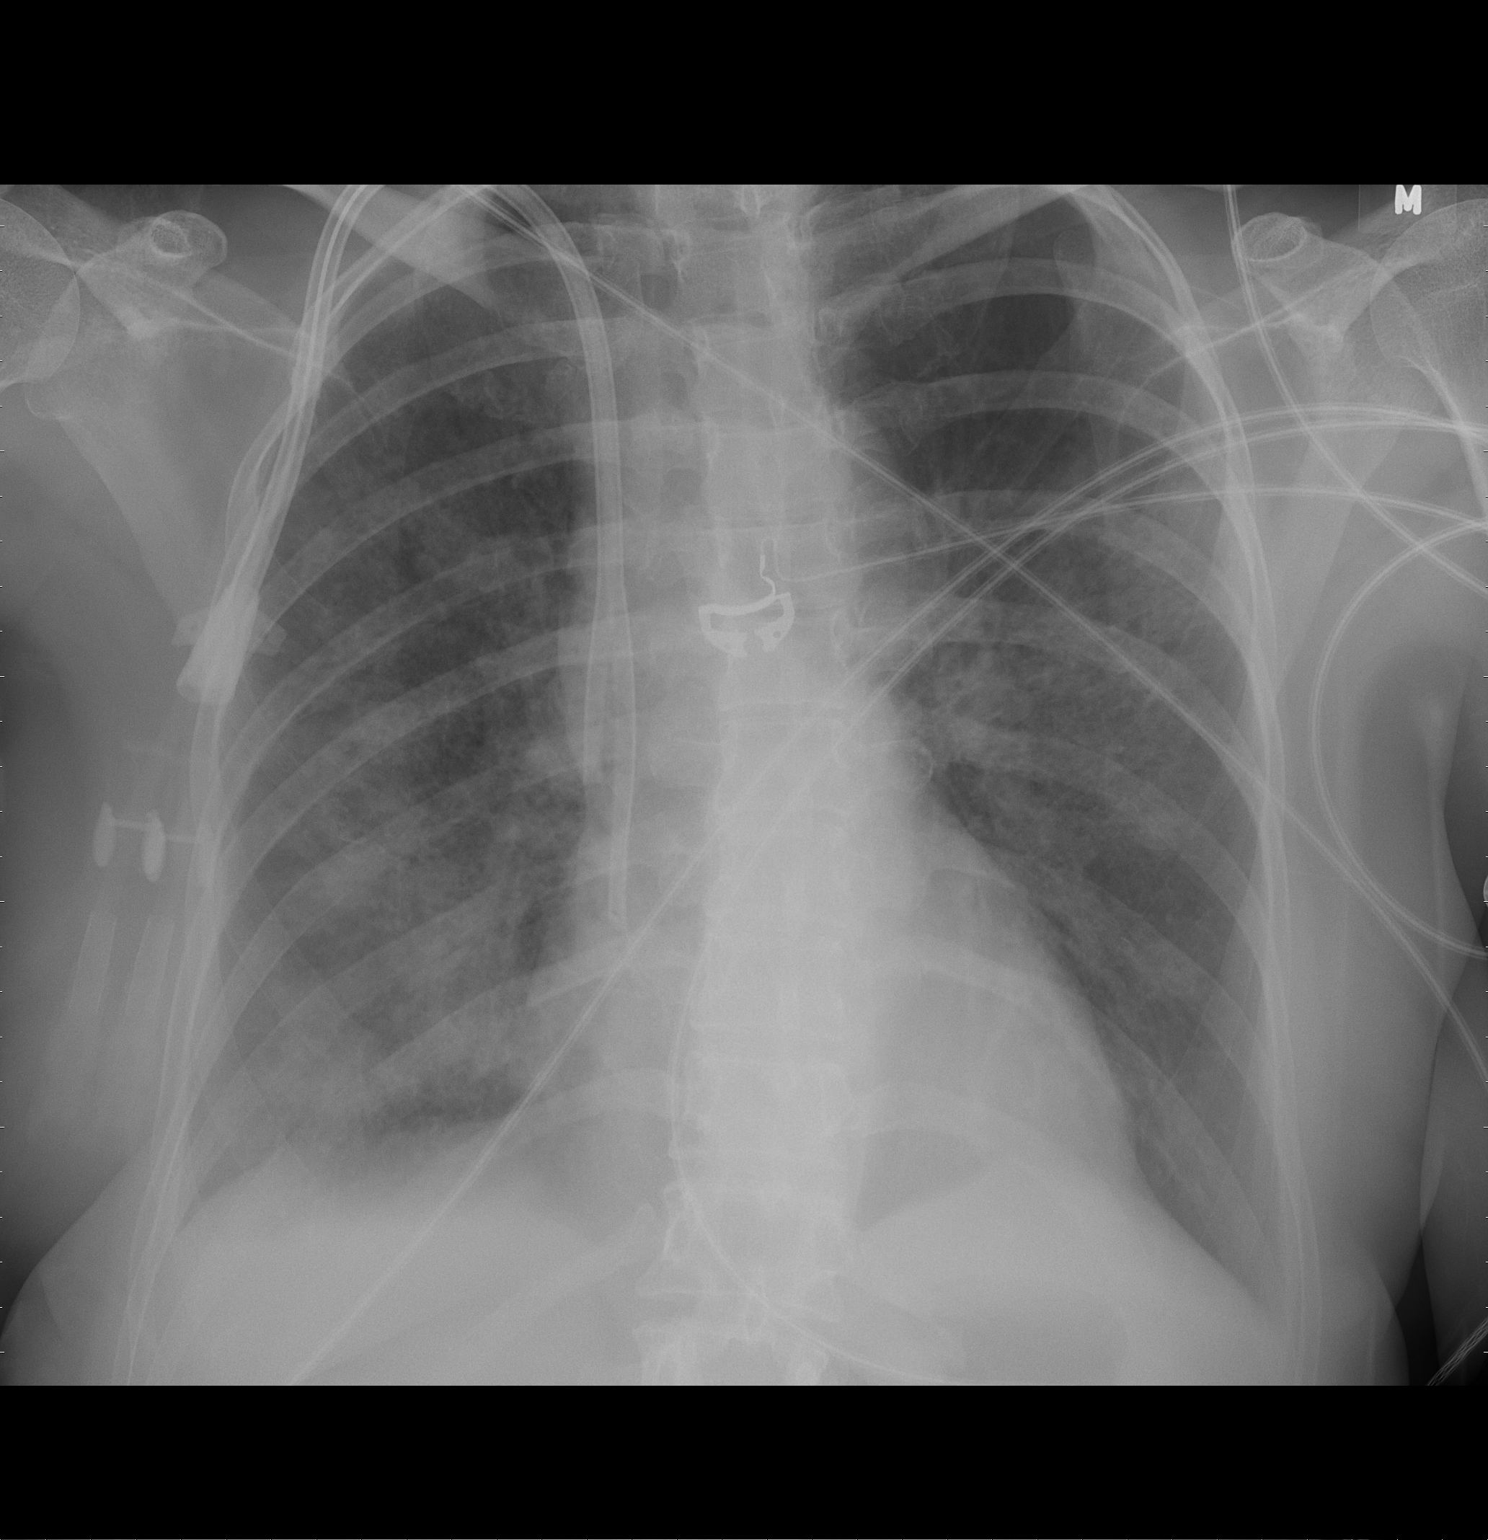

[1 of 1 positions shown; findings below may reference images not displayed]

FINDINGS: Right-sided Diatek catheter is seen with one tip
terminating in proximal superior vena cava and the other tip
terminating in proximal superior vena cava at cavoatrial junction.
No pneumothorax is evident. There is overall slight hyperinflation
configuration.

 Cardiac silhouette is normal size and shape and is smaller than on
the previous study.  There is improved aeration of the lungs with
decrease in the volume overload/pulmonary edema pattern and
decrease in the infiltrates within the perihilar regions and lower
lobes.  There is residual hazy infiltrative density seen within the
right lung base.  No pleural effusion is demonstrated.  There is
slight scoliosis convexity to the left.
IMPRESSION: Cardiac silhouette is normal size and shape and is smaller than on
the previous study.  There is improved aeration of the lungs with
decrease in the volume overload/pulmonary edema pattern and
decrease in the infiltrates within the perihilar regions and lower
lobes.  There is residual hazy infiltrative density seen within the
right lung base. There is overall slight hyperinflation
configuration.

## 2010-07-28 IMAGING — CR DG CHEST 2V
1 series · 1 of 1 positions shown · non-contrast
Comparison: Chest radiograph 09/06/2009

CLINICAL DATA: .  End-stage renal disease, pulmonary edema

CHEST - 2 VIEW

[view not recorded]
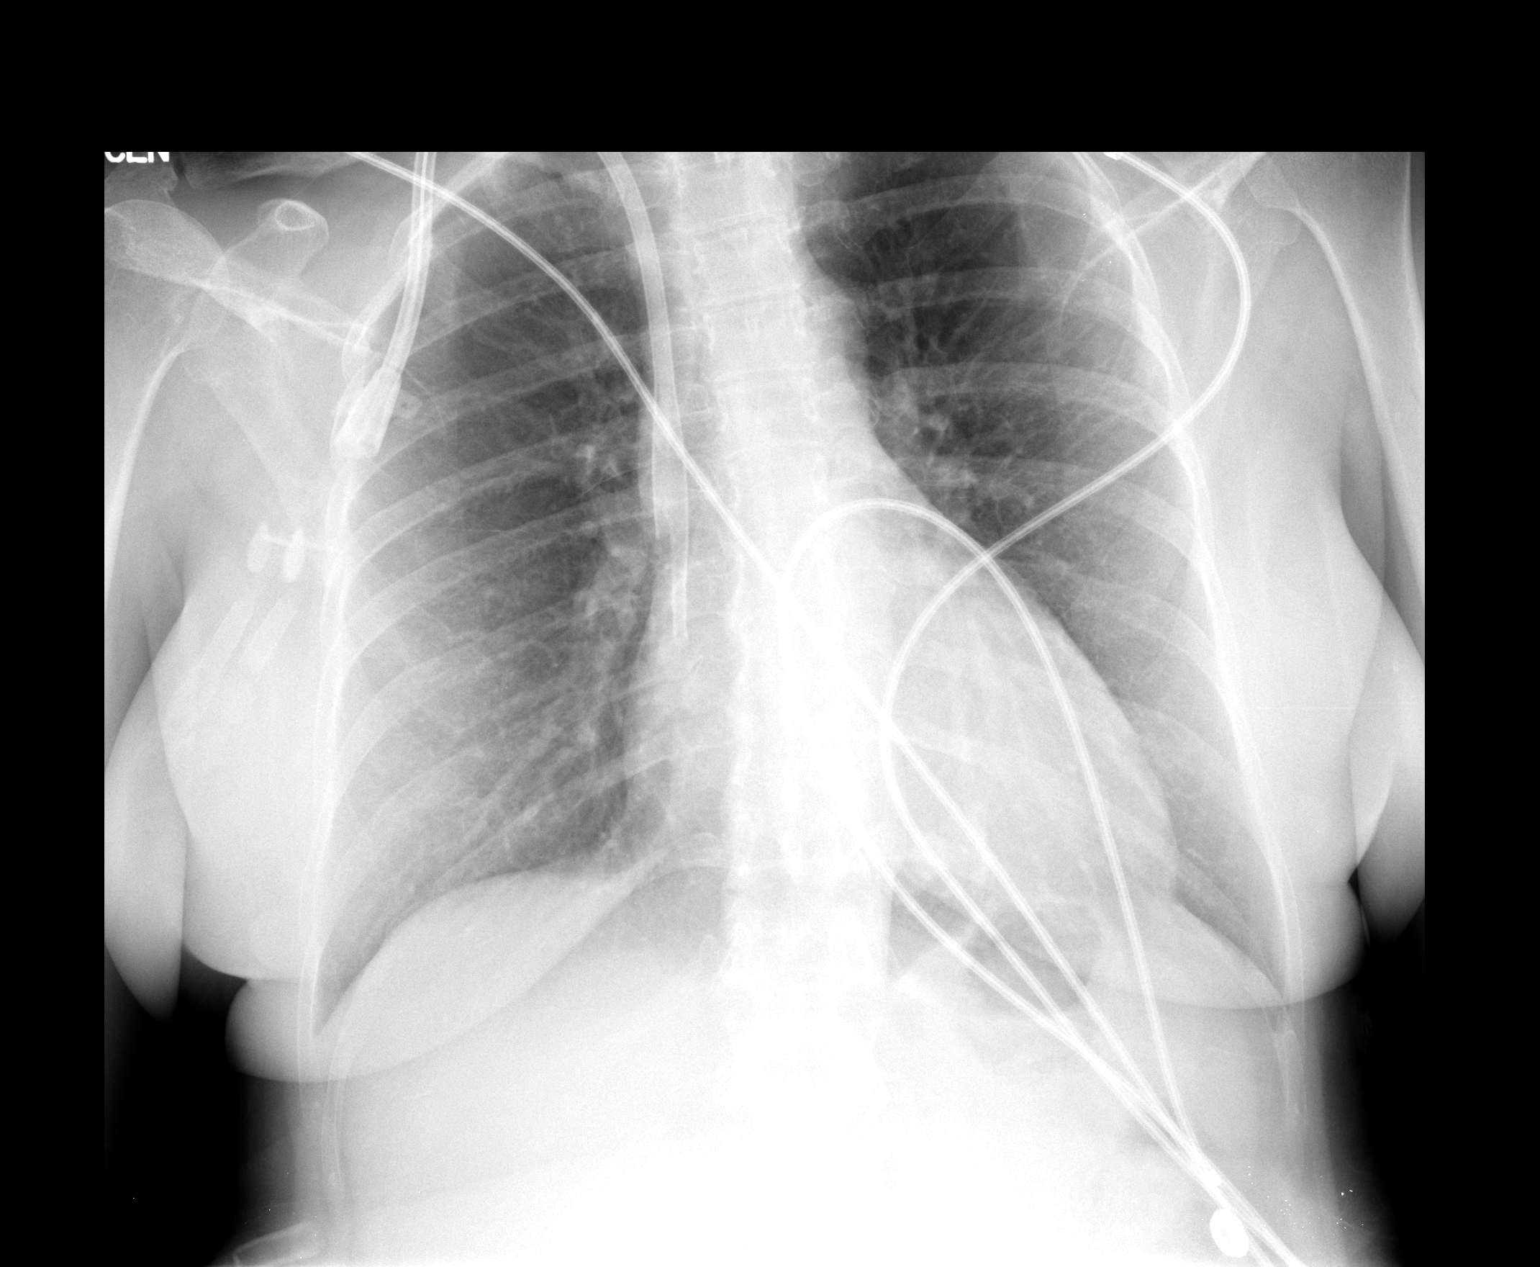

[1 of 1 positions shown; findings below may reference images not displayed]

FINDINGS: Large bore right central venous line is unchanged.
Stable cardiac silhouette.  There is improvement in the bilateral
air space disease seen on comparison exam.  Trace pleural
effusions.
IMPRESSION: 1.  Interval clearing of pulmonary edema pattern.
2.  Small effusions.

## 2010-07-29 LAB — RENAL FUNCTION PANEL
BUN: 29 mg/dL — ABNORMAL HIGH (ref 6–23)
CO2: 24 mEq/L (ref 19–32)
Calcium: 9.2 mg/dL (ref 8.4–10.5)
Chloride: 110 mEq/L (ref 96–112)
Creatinine, Ser: 2.17 mg/dL — ABNORMAL HIGH (ref 0.4–1.2)
GFR calc Af Amer: 30 mL/min — ABNORMAL LOW (ref >60)
GFR calc non Af Amer: 24 mL/min — ABNORMAL LOW (ref >60)
Glucose, Bld: 87 mg/dL (ref 70–99)

## 2010-07-29 LAB — IRON AND TIBC
Iron: 98 ug/dL (ref 42–135)
Saturation Ratios: 29 % (ref 20–55)
TIBC: 334 ug/dL (ref 250–470)
UIBC: 236 ug/dL

## 2010-07-29 LAB — CBC
MCV: 91.9 fL (ref 78.0–100.0)
Platelets: 371 10*3/uL (ref 150–400)
RBC: 3.71 MIL/uL — ABNORMAL LOW (ref 3.87–5.11)
WBC: 5.4 10*3/uL (ref 4.0–10.5)

## 2010-07-29 LAB — FERRITIN: Ferritin: 25 ng/mL (ref 10–291)

## 2010-07-31 LAB — RENAL FUNCTION PANEL
Albumin: 3.5 g/dL (ref 3.5–5.2)
BUN: 30 mg/dL — ABNORMAL HIGH (ref 6–23)
Chloride: 107 mEq/L (ref 96–112)
Creatinine, Ser: 2.13 mg/dL — ABNORMAL HIGH (ref 0.4–1.2)
GFR calc Af Amer: 30 mL/min — ABNORMAL LOW (ref >60)
GFR calc non Af Amer: 25 mL/min — ABNORMAL LOW (ref >60)
Phosphorus: 4.3 mg/dL (ref 2.3–4.6)
Potassium: 4.1 mEq/L (ref 3.5–5.1)

## 2010-07-31 LAB — PTH, INTACT AND CALCIUM
Calcium, Total (PTH): 9.5 mg/dL (ref 8.4–10.5)
PTH: 43 pg/mL (ref 14.0–72.0)

## 2010-07-31 LAB — CBC
HCT: 35.2 % — ABNORMAL LOW (ref 36.0–46.0)
Hemoglobin: 12.5 g/dL (ref 12.0–15.0)
MCV: 92.4 fL (ref 78.0–100.0)
Platelets: 394 10*3/uL (ref 150–400)
RDW: 12.9 % (ref 11.5–15.5)

## 2010-07-31 LAB — IRON AND TIBC: Saturation Ratios: 26 % (ref 20–55)

## 2010-08-04 LAB — RENAL FUNCTION PANEL
Albumin: 3.3 g/dL — ABNORMAL LOW (ref 3.5–5.2)
Chloride: 102 mEq/L (ref 96–112)
Phosphorus: 5.2 mg/dL — ABNORMAL HIGH (ref 2.3–4.6)
Potassium: 5.2 mEq/L — ABNORMAL HIGH (ref 3.5–5.1)
Sodium: 138 mEq/L (ref 135–145)

## 2010-08-04 LAB — PTH, INTACT AND CALCIUM
Calcium, Total (PTH): 9.3 mg/dL (ref 8.4–10.5)
PTH: 66.2 pg/mL (ref 14.0–72.0)

## 2010-08-04 LAB — POCT HEMOGLOBIN-HEMACUE: Hemoglobin: 12.9 g/dL (ref 12.0–15.0)

## 2010-08-04 LAB — IRON AND TIBC
Saturation Ratios: 25 % (ref 20–55)
TIBC: 312 ug/dL (ref 250–470)

## 2010-09-02 NOTE — Consult Note (Signed)
NAMEKOLBY, Erin Ward                 ACCOUNT NO.:  [id number]   MEDICAL RECORD NO.:  [id number]          PATIENT TYPE:  EMS   LOCATION:  MAJO                         FACILITY:  [facility]   PHYSICIAN:  Maree Krabbe, M.D.DATE OF BIRTH:  16-May-1960   DATE OF CONSULTATION:  DATE OF DISCHARGE:  04/09/2007                                 CONSULTATION   PRESENTING COMPLAINTS:  Fever.   HISTORY:  The patient is a 50 year old white female with a history of  ANCA-related glomerulonephritis taking prednisone and recently switched  from Cytoxan  to Imuran about a month ago followed by Dr. Kathrene Bongo.  She has a baseline creatinine in the low 2's and has diabetes on oral  agents and hypertension on Norvasc.  She called saying that she has had  6 days of fever.  She talked to the office earlier this week and they  basically prescribed supportive measures at home but her fevers have not  abated. She did not much in the way of other symptoms. She denies any  sore throat, headaches, poor productive cough, no dysuria or urinary  frequency. Temperatures have been up as high as 102 at home and they are  particularly worse at night. She did take a course of Avelox about 1  month ago for an upper respiratory infection with fever and productive  cough.   PAST MEDICAL HISTORY:  Asthma, non-insulin dependent diabetes, ANCA-  related glomerulonephritis, chronic kidney disease and chronic  immunosuppression.   MEDICATIONS:  1. Lexapro.  2. Yasmin 28.  3. Multivitamin.  4. Prednisone 20 a day.  5. Imuran 200 mg a day.  6. Glimepiride.  7. Lasix.  8. Norvasc.  9. Protonix.   SOCIAL HISTORY:  Noncontributory.   REVIEW OF SYSTEMS:  Noncontributory.   PHYSICAL EXAM:  VITAL SIGNS:  Temperature 101.0, blood pressure 120/80,  pulse 110, respiratory rate 18.  GENERAL:  The patient is alert and oriented.  She looks mildly  uncomfortable.  She has injected conjunctiva. Throat is clear, TMs are within  normal  limits.  NECK:  Supple with no lymphadenopathy.  CHEST:  Reveals bibasilar inspiratory rales at the bases which do not  clear with deep breathing or cough.  CARDIAC:  Regular rate and rh without murmur, rub or gallop.  ABDOMEN:  Soft, nontender without masses.  EXTREMITIES:  No peripheral edema or ulceration. No joint abnormalities.   LABORATORY DATA:  Urinalysis 3-6 white blood cells, 0-2 red blood cells,  greater than 300 protein, trace leukocyte esterase, white blood count  3.80, hemoglobin 10, platelets 278, creatinine 2.4, other chemistries  unremarkable. Chest x-ray normal.   IMPRESSION:  1. Fever of 5-6 days duration in an immunosuppressed patient with      abnormal breath sounds suggestive of early pneumonia.  Chest x-ray      is normal.  I think she should be treated regardless for community-      acquired pneumonia, probably atypical with Avelox for 10 days.  2. 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  3. Leukopenia due to Imuran stable.  4. Chronic immunosuppression.  5. Asthma.  6. Diabetes mellitus on oral agents.   PLAN:  Discharge home.  Avelox for 10 days. Follow-up with Dr.  Kathrene Bongo later this week.      Maree Krabbe, M.D.  Electronically Signed     RDS/MEDQ  D:  04/09/2007  T:  04/11/2007  Job:  [id number]

## 2010-09-02 NOTE — Discharge Summary (Signed)
NAMEJACKLINE, Erin Ward                 ACCOUNT NO.:  [id number]   MEDICAL RECORD NO.:  [id number]          PATIENT TYPE:  INP   LOCATION:  5522                         FACILITY:  [facility]   PHYSICIAN:  Cecille Aver, M.D.DATE OF BIRTH:  [birth year]   DATE OF ADMISSION:  10/28/2006  DATE OF DISCHARGE:  10/31/2006                               DISCHARGE SUMMARY   DISCHARGE DIAGNOSES:  1. Acute renal failure.  2. 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  3. Anxiety/depression.   PROCEDURES:  Percutaneous renal biopsy done October 29, 2006.   HISTORY OF PRESENT ILLNESS:  Ms. Impastato is a 50 year old white female who  was discovered to have hematuria, proteinuria and a rise in creatinine.  She was also noted to have positive C-ANCA titer of 1:80.  Therefore,  she was admitted for a percutaneous renal biopsy and initiation of  immunosuppressant treatment.  Patient was admitted on October 28, 2006  because her bleeding time was prolonged.  She was treated with FFP and  DDAVP with correction of her bleeding time so underwent a percutaneous  renal biopsy on October 29, 2006.  Patient tolerated procedure well.  She  required two units of packed RBCs for a pre-biopsy hemoglobin that was  near 8.0.  Hemoglobin remained stable throughout her hospitalization.  Due to presumed ANCA-associated vasculitis, she was started on Solu-  Medrol and Cytoxan.  Overall, she tolerated her initiation doses well.  There was an episode on October 30, 2006 of shortness of breath and nausea  which I felt  was likely related to side effects due to prednisone or  steroid therapy as well as anxiety and depression.  On July 13,  the  patient was feeling well and was deemed ready for discharge.  Discharge  labs include a hemoglobin of 10.8 which was the best that it had been  and creatinine 3.55 which was improved from 3.7 on discharge.  White  blood count is 31.4.   DISCHARGE MEDICATIONS:  1. Prednisone 80 mg  daily.  2. Protonix 40 mg daily.  3. Cytoxan 200 mg daily.  4. Percocet as needed for pain.  5. Phenergan as needed for pain.  6. Xanax as needed for pain.  7. Patient will continue on her Lexapro, multivitamin and birth      control pill.   Patient is to do no heavy lifting for two weeks, to increase her  activity slowly.  I will be in contact with her regarding her renal  pathology report.  The patient will follow up with labs at [facility] on Tuesday.          ______________________________  Cecille Aver, M.D.    KAG/MEDQ  D:  10/31/2006  T:  10/31/2006  Job:  [id number]   cc:    [facility]

## 2010-09-05 NOTE — Discharge Summary (Signed)
NAMEJANAT, Erin Ward                 ACCOUNT NO.:  [id number]   MEDICAL RECORD NO.:  [id number]          PATIENT TYPE:  INP   LOCATION:  1528                         [facility]ACILITY:  [facility]   PHYSICIAN:  Bonnell Public, MDDATE OF BIRTH:  1960/12/31   DATE OF ADMISSION:  08/08/2006  DATE OF DISCHARGE:  08/12/2006                               DISCHARGE SUMMARY   PRIMARY CARE Marialy Urbanczyk:  Osvaldo Human, M.D.   ADMITTING DIAGNOSES:  1. Abdominal pain.  2. Nausea and vomiting.  3. Diarrhea.  4. Renal insufficiency.  5. Hypertension.  6. Depression.  7. Leukocytosis.   DISCHARGE DIAGNOSES:  1. Likely viral gastroenteritis.  2. Abdominal pain.  3. Hypertension.  4. Renal insufficiency.  5. Proteinuria.  6. History of depression.   DISCHARGE MEDICATIONS:  1. Lisinopril 20 mg once daily.  2. Lexapro 10 mg once daily.  3. Yasmin one tablet p.o. once daily.  4. Multivitamin one tablet p.o. once daily.   CONSULTATIONS:  1. Nephrology consult.  On admission, the patient's creatinine was      elevated.  It was thought to be prerenal.  However, despite      adequate hydration, the patient's creatinine has remained elevated.      It was noted that the UA revealed proteinuria.  A 24-hour urine has      been collected for protein quantification.  Renal ultrasound was      essentially normal, except for a few left renal cysts.  2. GI consultation.  The patient was seen by Dr. Wilford Corner.   BRIEF HISTORY AND HOSPITAL COURSE:  Please refer to the H&P done on  July 23, 2006.  The patient is a 50 year old female with a past medical  history significant for depression, PMS, anxiety, asthma and probably  hypertension.  She may have had episodes of proteinuria and hematuria in  past, but it was never worked up.  The patient presented with diffuse  abdominal pain, diarrhea, fever and headaches.  She was admitted as a  case of acute gastroenteritis.  She was adequately rehydrated.  She was  started on IV Cipro and Flagyl for the gastroenteritis.  The stools  samples were sent for work-up, which came back normal.  Episodes of  elevated blood pressures were noted during her stay in the hospital.  Urine also revealed proteinuria.  A creatinine was elevated on admission  and has remained elevated.  A 24-hour urine has been collected.  Nephrology consult was done, and the nephrologist has ordered further  work-up including ANCA, anti-GBM, AFO, C3 and C4.  The plan is for the  patient to follow with the nephrologist as an outpatient.  On the day of  discharge, the patient was prescribed lisinopril 20 mg p.o. once daily.  The patient has done well.  All above symptoms have resolved.   VITAL SIGNS ON THE DAY OF DISCHARGE:  Temperature is 98.4, blood  pressure is 139/73 mmHg, heart rate is 71 beats per minute, and the  respiratory rate is 18 per minute.  The patient will be discharged back  home today  to the care of the primary care Abeer Iversen.   DISCHARGE PLAN:  1. Discharge patient home today.  2. Cardiac diet and low salt diet.  3. Activity as tolerated.  4. Follow with the primary care physician, Dr. Roderick Pee, in 1 week.  5. Follow up with nephrologist.  6. Follow up with GI physician, Dr. Wilford Corner.      Bonnell Public, MD  Electronically Signed     SIO/MEDQ  D:  08/12/2006  T:  08/12/2006  Job:  [id number]   cc:   Osvaldo Human, M.D.  Fax: [phone]

## 2010-09-05 NOTE — H&P (Signed)
NAMEROSALVA, NEARY                 ACCOUNT NO.:  [id number]   MEDICAL RECORD NO.:  [id number]          PATIENT TYPE:  INP   LOCATION:  0101                         FACILITY:  [facility]   PHYSICIAN:  Theone Stanley, MD   DATE OF BIRTH:  June 19, 1960   DATE OF ADMISSION:  08/08/2006  DATE OF DISCHARGE:                              HISTORY & PHYSICAL   CHIEF COMPLAINT:  Diffuse abdominal pain, diarrhea, fever, headaches.   HISTORY OF PRESENT ILLNESS:  Apparently all of her issues started  originally in March, when she had the flu. She was placed on Tamiflu.  Her symptoms resolved. On July 20, 2006, she started to have symptoms of  sore throat, headache, diarrhea, sinus infection. She was given a Z-pack  at that time. On July 28, 2006 she stated that after lunch, she started  having severe abdominal cramps with increasing abdominal pain. She  called the office. They indicated to start Zantac and a soft diet. She  describes the pain as labor-like in nature, colicky, lasting 3 to 5  minutes apart. She subsequently went to the office again. She was  checked for a urinary tract infection because she was also complaining  of burning with urination. At that point in time, she had no diarrhea.  She felt last Tuesday, shaky, nervous, and she had pain. She did state  that when she does have a bowel movement, it is intermittently with  diarrhea and when she does have a bowel movement, she has pain  associated with it. It is does not relieve her pain. She has had a low  grade temperature of 99.4. She went to her OB-GYN to see if this was  female related. He did not feel that this was the case and he told her  that she probably had some gastroenteritis issues and she was referred  to Dr. Evette Cristal. She has an appointment with him sometime coming this week.  On Friday, she started to have some headache, nausea, and vomiting. It  was noted that she also has an elevated blood pressure of 156/90. She  has not  been eating much secondary to the pain, nausea, vomiting, and no  appetite. She was given some Phenergan, however, this made her very  agitated, so she did not take it. She was afraid to.   PAST MEDICAL HISTORY:  Depression, PMS, anxiety, asthma. She has never  been admitted for this.   MEDICATIONS:  Albuterol p.r.n., Lexapro 10 daily, Yasmin 28, Bentyl  p.r.n., Phenergan p.r.n.   ALLERGIES:  NO KNOWN DRUG ALLERGIES. However she does have what appears  to be seasonal and mold allergy.   PAST SURGICAL HISTORY:  Appendectomy, hysteroscopy for fibroids.   FAMILY HISTORY:  Heart disease, hypertension, diabetes, stroke. A  grandmother died of colon cancer in her 18's. Father passed away at age  3 from osteosarcoma. She denies any Crohn's or ulcerative colitis in  the family.   SOCIAL HISTORY:  The patient lives in [place]. Is married. No  tobacco, alcohol, or illicit drug use.   REVIEW OF SYSTEMS:  Were multiple  as above in the HPI, including weight  loss, fatigue, orange colored diarrhea. She denies any bright red blood  per rectum or melena. Other systems were negative.   PHYSICAL EXAMINATION:  GENERAL:  Mrs. Maj is a very pleasant 50-year-  old female, lying in bed, appeared to be slightly miserable and not  toxic appearing.  VITAL SIGNS:  Temperature 98.5, blood pressure 157/107, pulse 97,  respiratory rate 20. Sating 97% on room air.  HEENT:  Head normocephalic and atraumatic. Eyes 3 mm equal and reactive.  Extraocular muscles intact. Ears, no discharge. Sclerae nonicteric.  Mucosa slightly dry.  NECK:  Supple.  HEART:  Regular rate and rhythm. No murmur, rub, or gallop appreciated.  LUNGS:  Clear to auscultation bilaterally.  ABDOMEN:  Soft. The patient had tenderness in the left lower quadrant,  the left upper quadrant, and mid epigastric region. She had some rebound  tenderness, voluntary guarding, but was not rigid.  RECTAL:  Examination was performed. The patient  had good sphincter tone.  She was mildly heme positive.  GENITOURINARY:  She had enlarged uterus with evidence on rectal  examination.  NEUROLOGIC:  Non-focal. The patient appeared to be appropriate for her  situation. Slightly depressed mood but able to talk without any  difficulties.  EXTREMITIES:  No clubbing, cyanosis, or edema.  VASCULAR:  The patient had good pulses radial, posterior tibial and  dorsalis pedis.   LABORATORY DATA:  White count 14. Hemoglobin and hematocrit 12 and 37.  Platelets 726,000. Urine pregnancy was negative. UA showed cloudy, large  amount of hemoglobin with pH of 6.0, specific gravity of 0.017, small  amount of bilirubin, ketones negative, greater than 300 protein. Small  amount of leukocyte esterase. Bacteria many. White blood cells 11 to 20.  Red blood cells 11 to 20.  Amorphous urates. Sodium 130, potassium 4.1,  chloride 100, CO2 28, glucose 112, BUN 16, creatinine 1.59, calcium 8.7,  protein 5.9, albumin 2.7, AST 15, ALT pending. Lipase was 16.   DIAGNOSTIC STUDIES:  CT of the abdomen was performed, which showed  multiple abnormal thick-walled small bowel loops in the abdomen and  extending into the pelvis, most likely infectious enteritis, less likely  inflammatory bowel disease, Crohn's, as the terminal ileum is  unremarkable. Pelvis showed moderate free fluid in the pelvis, abnormal  small bowel extending to the right lower pelvis. Terminal ileus is  unremarkable. Contrast is passed into the colon. Uterus markedly  enlarged with hypervascular masses, compatible with fibroids, the  largest measuring 9.4 cm. No adnexal masses.   ASSESSMENT/PLAN:  1. Abdominal pain, nausea, vomiting and diarrhea. Based on the current      information, this is most likely infectious in nature, with a      recent antibiotic. Although it was only a Z-pack, need to consider      C. difficile, however this is less likely. Will obtain stool     studies including ova  and parasite, C. diff and stool cultures. I      have asked Dr. Evette Cristal to see the patient for possible further      workup.  2. Renal insufficiency. Her urinalysis indicates that there might be      evidence of a urinary tract infection, however, with a protein of      greater than 300, this is an interesting finding. Will repeat the      urinalysis after hydrating the patient.  3. Hypertension. This is most likely secondary to her  pain and current      medical issue.  4. Depression. Will continue on her Lexapro.  5. Nausea and vomiting. Will provide antiemetics. I do not think that      placing an NG at this time would be the best course, since there is      no evidence of pancreatitis.  6. Leukocytosis, suspected secondary to abdominal pain, nausea,      vomiting, and diarrhea.  7. Thrombocytosis. This is an acute phase reactant. Will continue to      monitor this closely.  8. Infectious prophylaxis, start proton pump inhibitor and place STD's      on the patient.      Theone Stanley, MD  Electronically Signed     AEJ/MEDQ  D:  08/08/2006  T:  08/08/2006  Job:  [id number]   cc:   Graylin Shiver, M.D.  Fax: [phone]   Donia Guiles, M.D.  Fax: [phone]

## 2010-09-05 NOTE — Consult Note (Signed)
Erin Ward, Erin Ward                 ACCOUNT NO.:  [id number]   MEDICAL RECORD NO.:  [id number]          PATIENT TYPE:  INP   LOCATION:  1614                         FACILITY:  [facility]   PHYSICIAN:  Shirley Friar, MDDATE OF BIRTH:  November 19, 1960   DATE OF CONSULTATION:  08/09/2006  DATE OF DISCHARGE:                                 CONSULTATION   INDICATION:  1. Abdominal pain.  2. Diarrhea.   HPI:  Ms. Jeffrey is a patient seen at the request of Dr. Jomarie Longs in regards  to her abdominal pain and diarrhea.  She is a 50 year old white female  who was admitted for 3 weeks of crampy abdominal pain, watery diarrhea  and fevers.  She reports that it all started at the end of March and  beginning of April when she had flu with mainly upper respiratory  symptoms and was given Tamiflu and in the beginning when her symptoms  had not resolved she was given a Z-Pak at the early part of April as  well.  She denies any diarrhea or abdominal pain during this time.  On  April 9th she ate a chicken sandwich at [employer] and shortly  afterwards she started having crampy abdominal pain and watery diarrhea.  The first day she had the diarrhea she had profuse amounts of watery  diarrhea but denies any bloody stools.  She had one episode of vomiting  when she took a Pepcid but otherwise denies any vomiting.  The diarrhea  waxed and waned over the next several weeks but she continued to have  this crampy abdominal pain which she described as like labor.  The  abdominal pain, when she would have it, it would last approximately 5  minutes before the next wave of pain.  She went to the see her doctor  again and was set up to see a gastroenterologist and was scheduled to  see Dr. Evette Cristal on April 22 as an outpatient.  Due to continued episodes  of abdominal pain along with intermittent diarrhea and low grade fever  up to 99.4, her husband made her come in to the emergency room for  further evaluation.  On  presentation, she had a temperature of 99, blood  pressure 159/91, heart rate 83.  White blood count was found to be 14.1  with platelets of 726,000.  Abdominal CT scan was done which showed  multiple thickened loops of small intestine.   PAST MEDICAL HISTORY:  1. Depression.  2. Anxiety.  3. Asthma.   MEDICINES:  Albuterol, Lexapro, Yasmin, Bentyl, Phenergan.   ALLERGIES:  NO KNOWN DRUG ALLERGIES.   PAST SURGERIES:  Appendectomy.   FAMILY HISTORY:  Denies Crohn's or ulcerative colitis.   SOCIAL HISTORY:  Married, denies cigarettes, alcohol or drugs.   REVIEW OF SYSTEMS:  Negative except as stated above.   PHYSICAL EXAM:  VITAL SIGNS:  Temperature 97.9, pulse 80, blood pressure  140/70.  GENERAL:  Alert, no acute distress.  ABDOMEN:  Generalized tenderness with guarding, soft, nondistended,  positive bowel sounds, no rebound.   LABS:  White blood cell  count 12.1, hemoglobin 11.4, platelet count  561,000, which is down from white blood count of 14.1 and platelet count  of 726,000.  Potassium 3.6, BUN 10, creatinine 1.63, lipase 16.  Urinalysis:  Large blood, greater than 300 protein, small leukocytes, 11-  20 white blood cells, 11-20 RBCs, many bacteria.   IMPRESSION:  This is a 50 year old white female with crampy abdominal  pain, watery diarrhea and low grade fevers who had a CT scan showing  multiple thickened loops of small bowel.  Her presentation is consistent  with gastroenteritis but source is unclear.  It looks like she may have  a urinary tract infection as well and whether the two are related is  possible, although a gastrointestinal source is more likely as  contributing to both processes.  Exact organism is unclear based on her  ingestion of pork and chicken during this syndrome, I think she is at  risk for Salmonella, possibly pork tapeworm although less likely.  I  doubt she has an enteric pathogen like Shigella or invasive E. coli due  to lack of any blood  in stools, but this still needs to stay in the  differential.  Most likely will be a viral gastroenteritis.   RECOMMEND:  1. Continue IV Cipro, Flagyl.  2. Continue liquid diet for now.  3. Consider advancing diet on April 22 if patient doing okay.  4. Agree with stool studies for fecal white blood cells, O&P and stool      culture.  5. Will follow clinically and if symptoms persist or worsen then will      consider doing CT enterography, but otherwise will treat for      gastroenteritis and see if we can pinpoint the source.   Thank you for this consultation and allowing me to participate in her  care.      Shirley Friar, MD  Electronically Signed     VCS/MEDQ  D:  08/09/2006  T:  08/09/2006  Job:  [id number]

## 2010-09-16 ENCOUNTER — Other Ambulatory Visit (HOSPITAL_COMMUNITY): Payer: Self-pay | Admitting: Nephrology

## 2010-09-16 DIAGNOSIS — R195 Other fecal abnormalities: Secondary | ICD-10-CM

## 2010-09-18 ENCOUNTER — Encounter (HOSPITAL_COMMUNITY)
Admission: RE | Admit: 2010-09-18 | Discharge: 2010-09-18 | Disposition: A | Discharge status: Home / Self Care | Payer: 59 | Source: Ambulatory Visit | Attending: Nephrology | Admitting: Nephrology

## 2010-09-18 DIAGNOSIS — R195 Other fecal abnormalities: Secondary | ICD-10-CM | POA: Insufficient documentation

## 2010-09-18 MED ORDER — SODIUM PERTECHNETATE TC 99M INJECTION
10.0000 | Freq: Once | INTRAVENOUS | Status: AC | PRN
  Administered 2010-09-18: 10 via INTRAVENOUS

## 2011-01-08 LAB — RENAL FUNCTION PANEL
CO2: 21
Chloride: 105
Glucose, Bld: 95
Phosphorus: 4.9 — ABNORMAL HIGH
Potassium: 4.6
Sodium: 138

## 2011-01-08 LAB — CBC
HCT: 38.2
MCHC: 33.6
MCV: 100
Platelets: 452 — ABNORMAL HIGH
RDW: 17.1 — ABNORMAL HIGH

## 2011-01-08 LAB — IRON AND TIBC: Iron: 138 — ABNORMAL HIGH

## 2011-01-09 LAB — IRON AND TIBC
Iron: 75
Saturation Ratios: 21
Saturation Ratios: 14 — ABNORMAL LOW
TIBC: 349
TIBC: 375
UIBC: 274

## 2011-01-09 LAB — FERRITIN
Ferritin: 57 (ref 10–291)
Ferritin: 38 (ref 10–291)

## 2011-01-09 LAB — RENAL FUNCTION PANEL
Albumin: 3.8
Albumin: 3.7
BUN: 39 — ABNORMAL HIGH
CO2: 22
Calcium: 9.7
Chloride: 103
Creatinine, Ser: 2.08 — ABNORMAL HIGH
GFR calc Af Amer: 31 — ABNORMAL LOW
GFR calc non Af Amer: 22 — ABNORMAL LOW
GFR calc non Af Amer: 26 — ABNORMAL LOW
Potassium: 4.5

## 2011-01-09 LAB — PTH, INTACT AND CALCIUM: Calcium, Total (PTH): 9.5

## 2011-01-09 LAB — CBC
Hemoglobin: 13.1
MCHC: 34.1
RBC: 4.16

## 2011-01-12 LAB — RENAL FUNCTION PANEL
Albumin: 3.5
Phosphorus: 4.5
Potassium: 4.7
Sodium: 141

## 2011-01-12 LAB — CBC
Platelets: 368
RDW: 15.2

## 2011-01-15 LAB — FERRITIN: Ferritin: 44 (ref 10–291)

## 2011-01-15 LAB — RENAL FUNCTION PANEL
Albumin: 3.5
BUN: 30 — ABNORMAL HIGH
Chloride: 110
Creatinine, Ser: 1.94 — ABNORMAL HIGH
Glucose, Bld: 90

## 2011-01-15 LAB — CBC
MCHC: 35.3
RBC: 3.47 — ABNORMAL LOW
WBC: 4.4

## 2011-01-15 LAB — IRON AND TIBC
Iron: 127
TIBC: 365
UIBC: 238

## 2011-01-16 LAB — RENAL FUNCTION PANEL
Albumin: 3.7
Calcium: 9.2
GFR calc Af Amer: 30 — ABNORMAL LOW
GFR calc non Af Amer: 25 — ABNORMAL LOW
Phosphorus: 4.3
Potassium: 4.2
Sodium: 138

## 2011-01-16 LAB — IRON AND TIBC: TIBC: 329

## 2011-01-16 LAB — PTH, INTACT AND CALCIUM
Calcium, Total (PTH): 9.5
PTH: 32.3

## 2011-01-16 LAB — CBC
Hemoglobin: 12.1
MCHC: 34.6
MCV: 91.5
RDW: 12.6

## 2011-01-19 ENCOUNTER — Other Ambulatory Visit (HOSPITAL_COMMUNITY): Payer: Self-pay | Admitting: Obstetrics and Gynecology

## 2011-01-19 DIAGNOSIS — Z1231 Encounter for screening mammogram for malignant neoplasm of breast: Secondary | ICD-10-CM

## 2011-01-20 LAB — RENAL FUNCTION PANEL
Albumin: 3.6 g/dL (ref 3.5–5.2)
BUN: 30 mg/dL — ABNORMAL HIGH (ref 6–23)
BUN: 31 mg/dL — ABNORMAL HIGH (ref 6–23)
CO2: 25 mEq/L (ref 19–32)
Chloride: 108 mEq/L (ref 96–112)
Chloride: 109 mEq/L (ref 96–112)
GFR calc Af Amer: 34 mL/min — ABNORMAL LOW (ref >60)
GFR calc non Af Amer: 28 mL/min — ABNORMAL LOW (ref >60)
Glucose, Bld: 78 mg/dL (ref 70–99)
Potassium: 4.1 mEq/L (ref 3.5–5.1)
Potassium: 4.7 mEq/L (ref 3.5–5.1)
Sodium: 139 mEq/L (ref 135–145)

## 2011-01-20 LAB — IRON AND TIBC
Iron: 74 ug/dL (ref 42–135)
Iron: 136 ug/dL — ABNORMAL HIGH (ref 42–135)
Saturation Ratios: 39 % (ref 20–55)
TIBC: 351 ug/dL (ref 250–470)
UIBC: 274 ug/dL

## 2011-01-20 LAB — PTH, INTACT AND CALCIUM: Calcium, Total (PTH): 9.4 mg/dL (ref 8.4–10.5)

## 2011-01-20 LAB — POCT HEMOGLOBIN-HEMACUE: Hemoglobin: 11.9 g/dL — ABNORMAL LOW (ref 12.0–15.0)

## 2011-01-21 LAB — IRON AND TIBC
Iron: 97
Saturation Ratios: 28
UIBC: 251

## 2011-01-21 LAB — RENAL FUNCTION PANEL
BUN: 33 — ABNORMAL HIGH
CO2: 25
Chloride: 103
Creatinine, Ser: 2.15 — ABNORMAL HIGH
GFR calc non Af Amer: 25 — ABNORMAL LOW
Potassium: 4.6

## 2011-01-21 LAB — ANTI-NEUTROPHIL ANTIBODY

## 2011-01-23 LAB — URINE MICROSCOPIC-ADD ON

## 2011-01-23 LAB — DIFFERENTIAL
Basophils Relative: 0
Eosinophils Absolute: 0.1 — ABNORMAL LOW
Lymphs Abs: 0.1 — ABNORMAL LOW
Neutro Abs: 3.1
Neutrophils Relative %: 81 — ABNORMAL HIGH

## 2011-01-23 LAB — CBC
HCT: 31.2 — ABNORMAL LOW
Hemoglobin: 10.6 — ABNORMAL LOW
Hemoglobin: 11 — ABNORMAL LOW
Hemoglobin: 11.3 — ABNORMAL LOW
MCHC: 34
MCHC: 34
MCV: 107.3 — ABNORMAL HIGH
MCV: 108.3 — ABNORMAL HIGH
RBC: 2.9 — ABNORMAL LOW
RBC: 2.98 — ABNORMAL LOW
RBC: 3.19 — ABNORMAL LOW
RDW: 17.8 — ABNORMAL HIGH
WBC: 4
WBC: 4.6

## 2011-01-23 LAB — URINALYSIS, ROUTINE W REFLEX MICROSCOPIC
Glucose, UA: NEGATIVE
Protein, ur: >300 — AB
pH: 5.5

## 2011-01-23 LAB — URINE CULTURE

## 2011-01-23 LAB — COMPREHENSIVE METABOLIC PANEL
ALT: 28
BUN: 30 — ABNORMAL HIGH
CO2: 25
Calcium: 8.9
GFR calc non Af Amer: 22 — ABNORMAL LOW
Glucose, Bld: 87
Sodium: 137
Total Protein: 5.2 — ABNORMAL LOW

## 2011-01-23 LAB — CULTURE, BLOOD (ROUTINE X 2)

## 2011-01-26 LAB — CBC
HCT: 32.5 — ABNORMAL LOW
MCHC: 34.4
MCV: 110 — ABNORMAL HIGH
RBC: 2.96 — ABNORMAL LOW
WBC: 5.9

## 2011-01-26 LAB — RENAL FUNCTION PANEL
Albumin: 3.7
Chloride: 101
GFR calc Af Amer: 25 — ABNORMAL LOW
GFR calc non Af Amer: 21 — ABNORMAL LOW
Phosphorus: 4.2
Potassium: 5.1
Sodium: 135

## 2011-01-26 LAB — FERRITIN: Ferritin: 126 (ref 10–291)

## 2011-01-26 LAB — IRON AND TIBC: UIBC: 288

## 2011-01-27 LAB — FERRITIN: Ferritin: 143 (ref 10–291)

## 2011-01-27 LAB — RENAL FUNCTION PANEL
BUN: 59 — ABNORMAL HIGH
CO2: 23
Calcium: 8.9
Calcium: 9.4
Chloride: 104
GFR calc Af Amer: 23 — ABNORMAL LOW
GFR calc non Af Amer: 19 — ABNORMAL LOW
Glucose, Bld: 127 — ABNORMAL HIGH
Phosphorus: 4.4
Potassium: 4.4
Sodium: 138
Sodium: 132 — ABNORMAL LOW

## 2011-01-27 LAB — IRON AND TIBC
Iron: 82
Saturation Ratios: 23
UIBC: 270

## 2011-01-27 LAB — CBC
HCT: 33.9 — ABNORMAL LOW
Hemoglobin: 11.2 — ABNORMAL LOW
MCHC: 34.2
Platelets: 520 — ABNORMAL HIGH
RBC: 2.95 — ABNORMAL LOW
RBC: 3.04 — ABNORMAL LOW
WBC: 5.1
WBC: 8.7

## 2011-01-29 LAB — RENAL FUNCTION PANEL
Albumin: 3.5
Albumin: 3.5
Albumin: 3.2 — ABNORMAL LOW
BUN: 54 — ABNORMAL HIGH
BUN: 44 — ABNORMAL HIGH
CO2: 22
Calcium: 8.9
Calcium: 8.8
Calcium: 8.8
Creatinine, Ser: 2.08 — ABNORMAL HIGH
Creatinine, Ser: 2.07 — ABNORMAL HIGH
GFR calc Af Amer: 31 — ABNORMAL LOW
GFR calc Af Amer: 32 — ABNORMAL LOW
GFR calc non Af Amer: 26 — ABNORMAL LOW
GFR calc non Af Amer: 27 — ABNORMAL LOW
Glucose, Bld: 137 — ABNORMAL HIGH
Phosphorus: 3.7
Phosphorus: 4.3
Phosphorus: 4.8 — ABNORMAL HIGH
Potassium: 5
Sodium: 137
Sodium: 135

## 2011-01-29 LAB — IRON AND TIBC
Iron: 103
Saturation Ratios: 35
TIBC: 296
UIBC: 193

## 2011-01-29 LAB — CBC
HCT: 27 — ABNORMAL LOW
MCHC: 34.5
MCHC: 34.9
MCV: 109 — ABNORMAL HIGH
Platelets: 278
Platelets: 466 — ABNORMAL HIGH
Platelets: 321
RBC: 2.5 — ABNORMAL LOW
RDW: 25.4 — ABNORMAL HIGH
WBC: 3.6 — ABNORMAL LOW
WBC: 5.6

## 2011-01-30 LAB — IRON AND TIBC: Iron: 140 — ABNORMAL HIGH

## 2011-01-30 LAB — RENAL FUNCTION PANEL
BUN: 51 — ABNORMAL HIGH
CO2: 23
Chloride: 105
Creatinine, Ser: 2.01 — ABNORMAL HIGH
GFR calc non Af Amer: 27 — ABNORMAL LOW
Potassium: 3.8

## 2011-01-30 LAB — CBC
HCT: 25.1 — ABNORMAL LOW
MCHC: 34.2
MCV: 101.5 — ABNORMAL HIGH
Platelets: 618 — ABNORMAL HIGH

## 2011-02-03 ENCOUNTER — Ambulatory Visit (HOSPITAL_COMMUNITY)
Admission: RE | Admit: 2011-02-03 | Discharge: 2011-02-03 | Disposition: A | Discharge status: Home / Self Care | Payer: 59 | Source: Ambulatory Visit | Attending: Obstetrics and Gynecology | Admitting: Obstetrics and Gynecology

## 2011-02-03 DIAGNOSIS — Z1231 Encounter for screening mammogram for malignant neoplasm of breast: Secondary | ICD-10-CM

## 2011-02-03 LAB — CBC
HCT: 27.9 — ABNORMAL LOW
Hemoglobin: 10.8 — ABNORMAL LOW
Hemoglobin: 6.9 — CL
MCHC: 34.1
Platelets: 477 — ABNORMAL HIGH
Platelets: 523 — ABNORMAL HIGH
Platelets: 531 — ABNORMAL HIGH
RDW: 13.9
RDW: 14
RDW: 14.2 — ABNORMAL HIGH
WBC: 14.7 — ABNORMAL HIGH
WBC: 21.4 — ABNORMAL HIGH

## 2011-02-03 LAB — RENAL FUNCTION PANEL
Albumin: 2.8 — ABNORMAL LOW
Albumin: 2.8 — ABNORMAL LOW
Calcium: 8.3 — ABNORMAL LOW
Chloride: 101
GFR calc Af Amer: 16 — ABNORMAL LOW
Glucose, Bld: 152 — ABNORMAL HIGH
Phosphorus: 5.8 — ABNORMAL HIGH
Phosphorus: 5.8 — ABNORMAL HIGH
Potassium: 4.1
Potassium: 4.9
Sodium: 136
Sodium: 137

## 2011-02-03 LAB — PREPARE FRESH FROZEN PLASMA

## 2011-02-03 LAB — CROSSMATCH: Antibody Screen: NEGATIVE

## 2011-02-03 LAB — BLEEDING TIME
Bleeding Time: 10.5 — ABNORMAL HIGH
Bleeding Time: 7.5

## 2011-05-15 IMAGING — CR DG CHEST 2V
2 series · 2 of 2 positions shown · non-contrast
Comparison: 11/04/2009

CLINICAL DATA: Carpal tunnel syndrome. Pre-op respiratory exam.

CHEST - 2 VIEW

[view not recorded (1 of 2)]
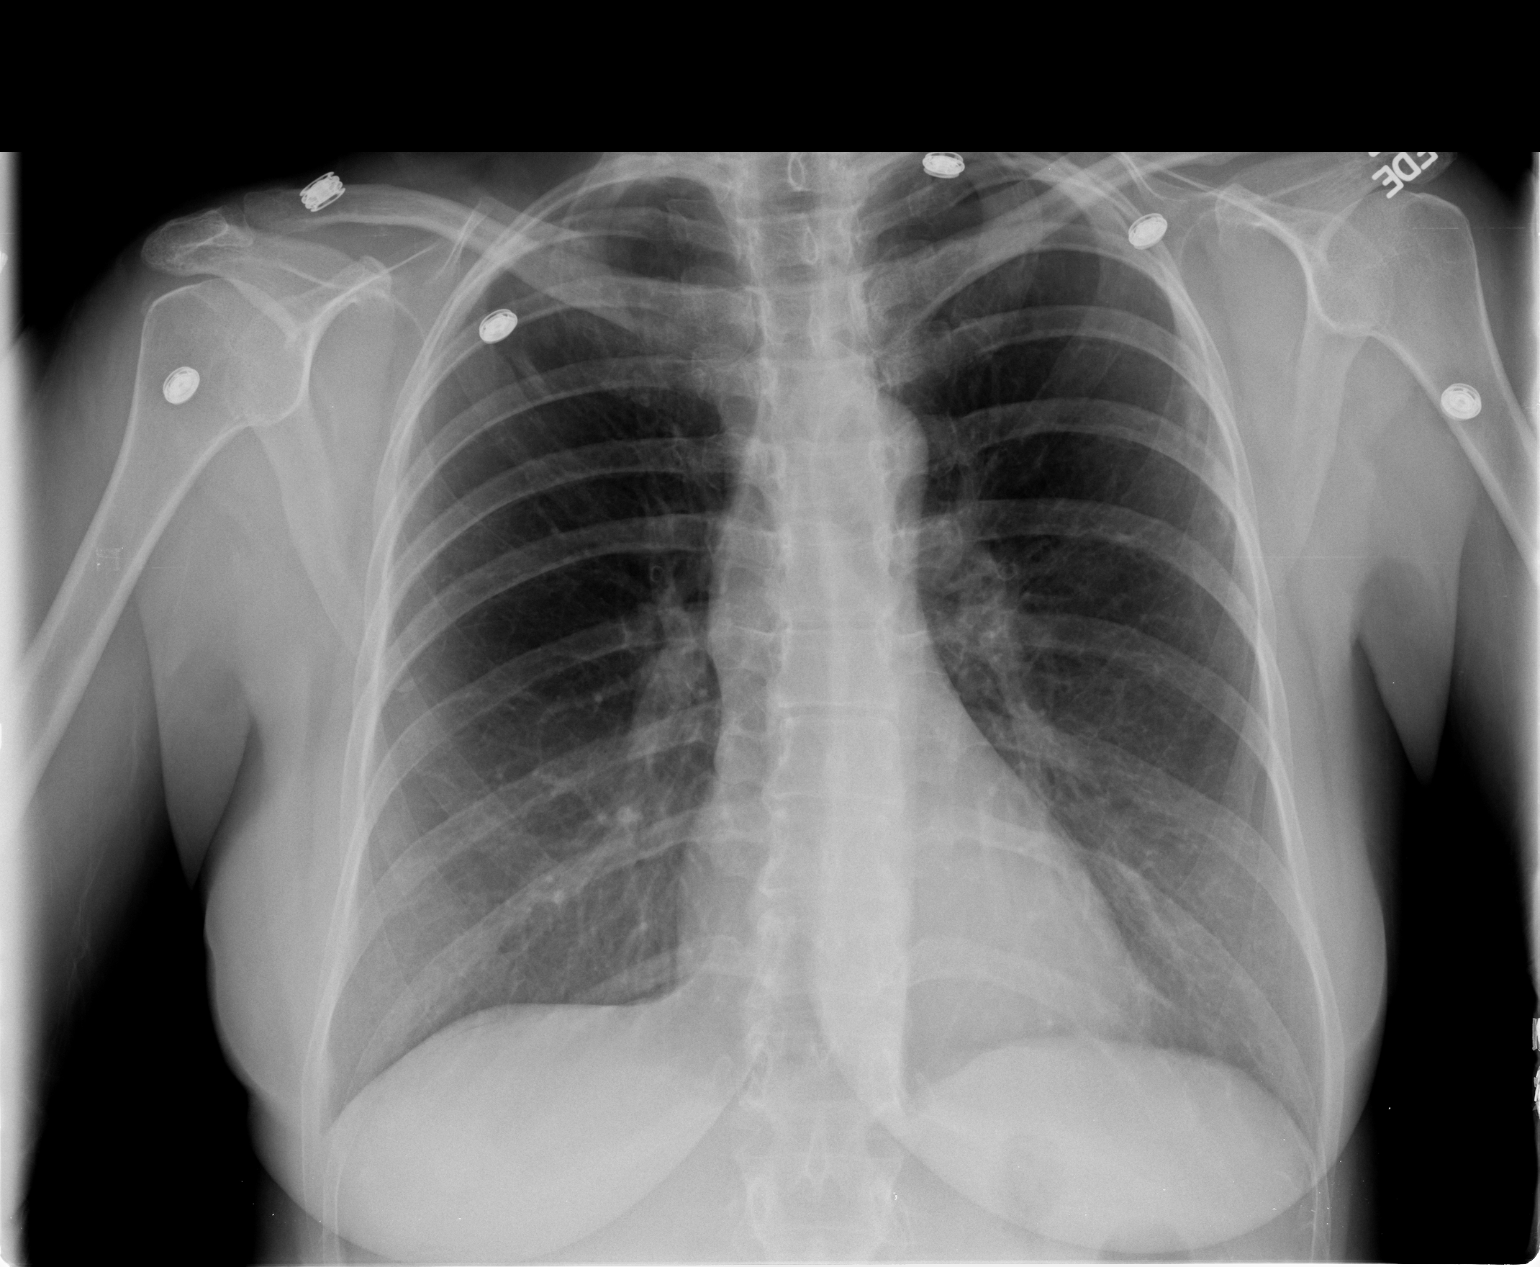

[view not recorded (2 of 2)]
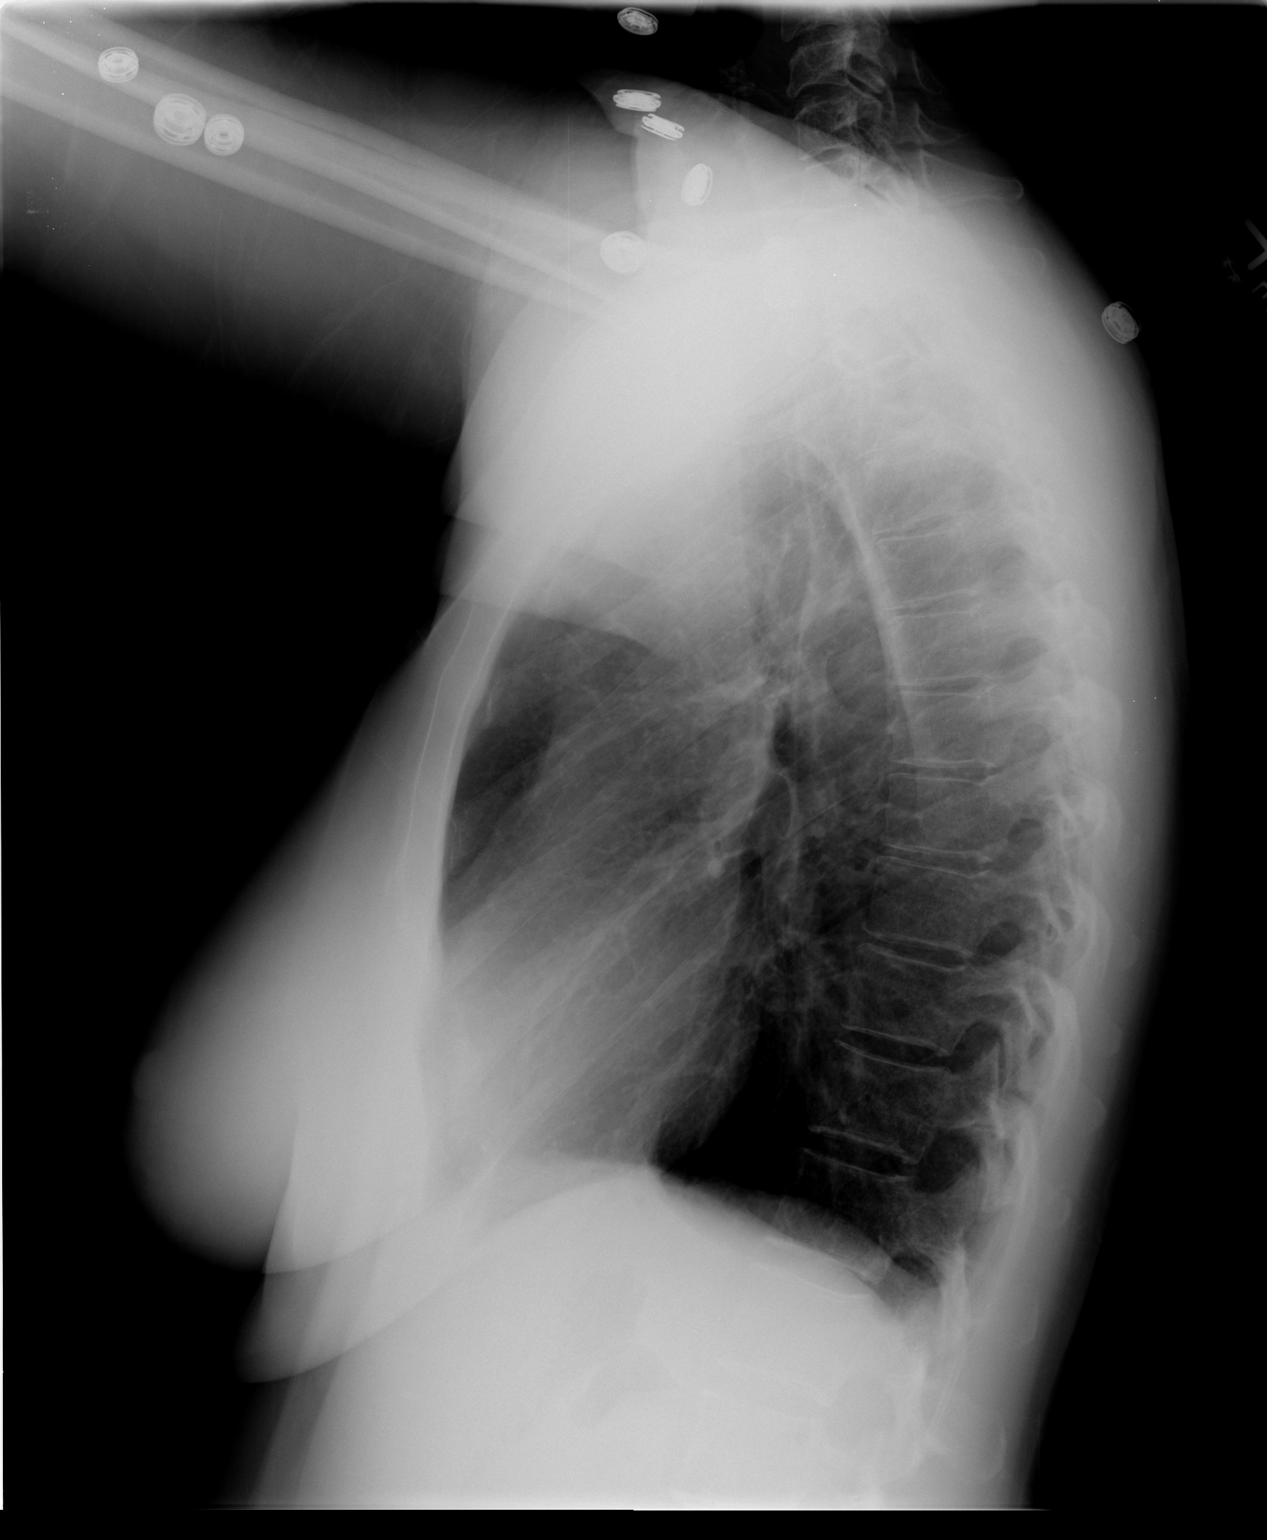

[2 of 2 positions shown; findings below may reference images not displayed]

FINDINGS: Heart size and mediastinal contours are normal.  Both
lungs are clear.  No evidence of pleural effusion.  No mass or
adenopathy identified.
IMPRESSION: No active cardiopulmonary disease.

## 2011-05-21 DIAGNOSIS — N186 End stage renal disease: Secondary | ICD-10-CM | POA: Diagnosis not present

## 2011-06-16 DIAGNOSIS — I10 Essential (primary) hypertension: Secondary | ICD-10-CM | POA: Diagnosis not present

## 2011-06-16 DIAGNOSIS — N186 End stage renal disease: Secondary | ICD-10-CM | POA: Diagnosis not present

## 2011-06-16 DIAGNOSIS — Z01810 Encounter for preprocedural cardiovascular examination: Secondary | ICD-10-CM | POA: Diagnosis not present

## 2011-06-16 DIAGNOSIS — Z01818 Encounter for other preprocedural examination: Secondary | ICD-10-CM | POA: Diagnosis not present

## 2011-06-16 DIAGNOSIS — Z940 Kidney transplant status: Secondary | ICD-10-CM | POA: Diagnosis not present

## 2011-06-16 DIAGNOSIS — Z1159 Encounter for screening for other viral diseases: Secondary | ICD-10-CM | POA: Diagnosis not present

## 2011-06-18 DIAGNOSIS — N186 End stage renal disease: Secondary | ICD-10-CM | POA: Diagnosis not present

## 2011-06-19 DIAGNOSIS — N186 End stage renal disease: Secondary | ICD-10-CM | POA: Diagnosis not present

## 2011-06-29 DIAGNOSIS — Z452 Encounter for adjustment and management of vascular access device: Secondary | ICD-10-CM | POA: Diagnosis not present

## 2011-06-29 DIAGNOSIS — Z940 Kidney transplant status: Secondary | ICD-10-CM | POA: Insufficient documentation

## 2011-06-29 DIAGNOSIS — N186 End stage renal disease: Secondary | ICD-10-CM | POA: Diagnosis not present

## 2011-06-29 DIAGNOSIS — R918 Other nonspecific abnormal finding of lung field: Secondary | ICD-10-CM | POA: Diagnosis not present

## 2011-06-30 DIAGNOSIS — R918 Other nonspecific abnormal finding of lung field: Secondary | ICD-10-CM | POA: Diagnosis not present

## 2011-06-30 DIAGNOSIS — Z452 Encounter for adjustment and management of vascular access device: Secondary | ICD-10-CM | POA: Diagnosis not present

## 2011-07-01 DIAGNOSIS — N186 End stage renal disease: Secondary | ICD-10-CM | POA: Diagnosis not present

## 2011-07-06 ENCOUNTER — Emergency Department (HOSPITAL_COMMUNITY)
Admission: EM | Admit: 2011-07-06 | Discharge: 2011-07-06 | Disposition: A | Discharge status: Home / Self Care | Payer: 59 | Attending: Emergency Medicine | Admitting: Emergency Medicine

## 2011-07-06 ENCOUNTER — Encounter (HOSPITAL_COMMUNITY): Payer: Self-pay | Admitting: *Deleted

## 2011-07-06 DIAGNOSIS — Z940 Kidney transplant status: Secondary | ICD-10-CM | POA: Insufficient documentation

## 2011-07-06 DIAGNOSIS — R635 Abnormal weight gain: Secondary | ICD-10-CM | POA: Insufficient documentation

## 2011-07-06 DIAGNOSIS — R339 Retention of urine, unspecified: Secondary | ICD-10-CM | POA: Insufficient documentation

## 2011-07-06 DIAGNOSIS — R109 Unspecified abdominal pain: Secondary | ICD-10-CM | POA: Insufficient documentation

## 2011-07-06 HISTORY — DX: Disorder of kidney and ureter, unspecified: N28.9

## 2011-07-06 LAB — BASIC METABOLIC PANEL
CO2: 24 mEq/L (ref 19–32)
Chloride: 103 mEq/L (ref 96–112)
Potassium: 4.3 mEq/L (ref 3.5–5.1)
Sodium: 137 mEq/L (ref 135–145)

## 2011-07-06 LAB — CBC
Hemoglobin: 11.8 g/dL — ABNORMAL LOW (ref 12.0–15.0)
Platelets: 386 10*3/uL (ref 150–400)
RBC: 3.38 MIL/uL — ABNORMAL LOW (ref 3.87–5.11)
WBC: 9.3 10*3/uL (ref 4.0–10.5)

## 2011-07-06 LAB — URINALYSIS, ROUTINE W REFLEX MICROSCOPIC
Glucose, UA: NEGATIVE mg/dL
Leukocytes, UA: NEGATIVE
Protein, ur: NEGATIVE mg/dL
Specific Gravity, Urine: 1.037 — ABNORMAL HIGH (ref 1.005–1.030)
pH: 6 (ref 5.0–8.0)

## 2011-07-06 NOTE — ED Notes (Signed)
Pt d.c home in NAD. Pt voiced gratitude for staff. Pt voiced understanding of d/c instructions.

## 2011-07-06 NOTE — ED Notes (Addendum)
Pt c/o dec urine output. Pt states today she has had 60oz of fluids today and has only put out 100cc urine today. Pt had kidney transplant surgery on Monday. Pt does state that she has "burning like UTI." Pt needs ED to contact transport coordinator for further instructions. MD will contact.

## 2011-07-06 NOTE — ED Notes (Signed)
To ED for eval of decreased urinary output. Pt is a recent kidney recipient. Surgery done at [facility]  Pt has been keeping track of I/O's. Called transplant nurse with decreased outpt from today and told to come to ED for a cath. Pt states she did have diarrhea yesterday.

## 2011-07-06 NOTE — ED Notes (Signed)
Md at bedside

## 2011-07-06 NOTE — ED Provider Notes (Signed)
History     CSN: [id number]  Arrival date & time 07/06/11  1956   First MD Initiated Contact with Patient 07/06/11 2024      Chief Complaint  Patient presents with  . Urinary Retention    (Consider location/radiation/quality/duration/timing/severity/associated sxs/prior treatment) HPI Pt had kidney transplant 1 week ago, here with urinary retention and weight gain.  States she has had 60 oz's of fluids today with only low output.  Has mild R flank discomfort associated.  Sx's moderat, no alleviating factors.  Past Medical History  Diagnosis Date  . Renal disorder     Past Surgical History  Procedure Date  . Kidney transplant     History reviewed. No pertinent family history.  History  Substance Use Topics  . Smoking status: Never Smoker   . Smokeless tobacco: Not on file  . Alcohol Use: No    OB History    Grav Para Term Preterm Abortions TAB SAB Ect Mult Living                  Review of Systems  Constitutional: Negative for fever and chills.  Genitourinary: Positive for dysuria, frequency and flank pain.  All other systems reviewed and are negative.    Allergies  Review of patient's allergies indicates no known allergies.  Home Medications   Current Outpatient Rx  Name Route Sig Dispense Refill  . ACETAMINOPHEN 500 MG PO TABS Oral Take 500 mg by mouth every 6 (six) hours as needed. For pain    . ASPIRIN 81 MG PO CHEW Oral Chew 81 mg by mouth daily.    Marland Kitchen CARVEDILOL 25 MG PO TABS Oral Take 12.5 mg by mouth 2 (two) times daily with a meal.    . DIPHENHYDRAMINE HCL 25 MG PO TABS Oral Take 25 mg by mouth at bedtime as needed. For sleep    . GABAPENTIN 100 MG PO CAPS Oral Take 100 mg by mouth daily.    Marland Kitchen HYDROMORPHONE HCL 2 MG PO TABS Oral Take 2-4 mg by mouth every 4 (four) hours as needed. For pain    . MYCOPHENOLATE MOFETIL 250 MG PO CAPS Oral Take 1,000 mg by mouth 2 (two) times daily. At 9am and 9pm    . OMEPRAZOLE 20 MG PO CPDR Oral Take 20 mg by  mouth daily.    Marland Kitchen POLYETHYLENE GLYCOL 3350 PO PACK Oral Take 17 g by mouth daily.    Marland Kitchen PREDNISONE 5 MG PO TABS Oral Take 30 mg by mouth daily.    Raul Del SODIUM 8.6-50 MG PO TABS Oral Take 2 tablets by mouth 2 (two) times daily. At 9am and 5pm    . SULFAMETHOXAZOLE-TMP DS 800-160 MG PO TABS Oral Take 1 tablet by mouth daily. Takes 1 tablet on Monday, Wednesday, and Friday at 9am    . TACROLIMUS 1 MG PO CAPS Oral Take 4 mg by mouth 2 (two) times daily. At 9am and 9pm    . VENLAFAXINE HCL ER 75 MG PO CP24 Oral Take 150 mg by mouth daily.      BP 139/91  Pulse 81  Temp(Src) 98.5 F (36.9 C) (Oral)  Resp 16  SpO2 99%RA wnl  Physical Exam  Nursing note and vitals reviewed. Constitutional: She appears well-developed and well-nourished.  HENT:  Head: Normocephalic and atraumatic.  Eyes: Right eye exhibits no discharge. Left eye exhibits no discharge.  Neck: Normal range of motion. Neck supple.  Cardiovascular: Normal rate, regular rhythm and normal heart sounds.  Pulmonary/Chest: Effort normal and breath sounds normal.  Abdominal: Soft. There is tenderness (mild, R abd, surgical wounds c/d/i). There is no rebound and no guarding.  Musculoskeletal: She exhibits no edema and no tenderness.  Neurological: She is alert. GCS eye subscore is 4. GCS verbal subscore is 5. GCS motor subscore is 6.  Skin: Skin is warm and dry.  Psychiatric: She has a normal mood and affect. Her behavior is normal.    ED Course  Procedures (including critical care time)  Labs Reviewed  CBC - Abnormal; Notable for the following:    RBC 3.38 (*)    Hemoglobin 11.8 (*)    HCT 35.3 (*)    MCV 104.4 (*)    MCH 34.9 (*)    All other components within normal limits  BASIC METABOLIC PANEL - Abnormal; Notable for the following:    Glucose, Bld 106 (*)    BUN 30 (*)    GFR calc non Af Amer 61 (*)    GFR calc Af Amer 71 (*)    All other components within normal limits  URINALYSIS, ROUTINE W REFLEX  MICROSCOPIC - Abnormal; Notable for the following:    Specific Gravity, Urine 1.037 (*)    All other components within normal limits  URINE CULTURE   No results found.   1. Urinary retention       MDM  Pt is in nad, afvss, nontoxic appearing, exam and hx as above. Have paged the transplant coordinator at [facility].  Placing foley, checking UA bmp.  ua wnl, crt nl and less than 1.2.  Pt has had >1cc/min uop with over 100 cc's here.    Talked with Particia Nearing, transplant coordinator, labs values and uop discussed. Will see pt as scheduled in 2 days, no need for leaving foley in.         Elijio Miles, MD 07/06/11 2322

## 2011-07-06 NOTE — Discharge Instructions (Signed)
Acute Urinary Retention, Female Urinary retention means you are unable to pee completely or at all (empty your bladder). HOME CARE  Drink enough fluids to keep your pee (urine) clear or pale yellow.   If you are sent home with a tube that drains the bladder (catheter), there will be a drainage bag attached to it.   Keep the drainage bag emptied.   Keep the drainage bag lower than the tube.   Only take medicine as told by your doctor.  GET HELP RIGHT AWAY IF:   You have chills.   You have a temperature by mouth above 102 F (38.9 C), not controlled by medicine.   You feel sick.   You develop belly (abdominal) or back pain.  MAKE SURE YOU:   Understand these instructions.   Will watch your condition.   Will get help right away if you are not doing well or get worse.  Document Released: 09/23/2007 Document Revised: 03/26/2011 Document Reviewed: 03/08/2009 [facility] Patient Information 2012 [place], [place].  Return for any new or worsening symptoms or any other concerns.

## 2011-07-06 NOTE — ED Notes (Signed)
Catheter placed using sterile technique. Clear, yellow urine returned.

## 2011-07-07 LAB — URINE CULTURE
Colony Count: NO GROWTH
Culture  Setup Time: 201303182241

## 2011-07-07 NOTE — ED Provider Notes (Signed)
  I saw and evaluated the patient, reviewed the resident's note and I agree with the findings and plan.  Results for orders placed during the hospital encounter of 07/06/11  CBC      Component Value Range   WBC 9.3  4.0 - 10.5 (K/uL)   RBC 3.38 (*) 3.87 - 5.11 (MIL/uL)   Hemoglobin 11.8 (*) 12.0 - 15.0 (g/dL)   HCT 16.1 (*) 09.6 - 46.0 (%)   MCV 104.4 (*) 78.0 - 100.0 (fL)   MCH 34.9 (*) 26.0 - 34.0 (pg)   MCHC 33.4  30.0 - 36.0 (g/dL)   RDW 04.5  40.9 - 81.1 (%)   Platelets 386  150 - 400 (K/uL)  BASIC METABOLIC PANEL      Component Value Range   Sodium 137  135 - 145 (mEq/L)   Potassium 4.3  3.5 - 5.1 (mEq/L)   Chloride 103  96 - 112 (mEq/L)   CO2 24  19 - 32 (mEq/L)   Glucose, Bld 106 (*) 70 - 99 (mg/dL)   BUN 30 (*) 6 - 23 (mg/dL)   Creatinine, Ser 9.14  0.50 - 1.10 (mg/dL)   Calcium 8.7  8.4 - 78.2 (mg/dL)   GFR calc non Af Amer 61 (*) >90 (mL/min)   GFR calc Af Amer 71 (*) >90 (mL/min)  URINALYSIS, ROUTINE W REFLEX MICROSCOPIC      Component Value Range   Color, Urine YELLOW  YELLOW    APPearance CLEAR  CLEAR    Specific Gravity, Urine 1.037 (*) 1.005 - 1.030    pH 6.0  5.0 - 8.0    Glucose, UA NEGATIVE  NEGATIVE (mg/dL)   Hgb urine dipstick NEGATIVE  NEGATIVE    Bilirubin Urine NEGATIVE  NEGATIVE    Ketones, ur NEGATIVE  NEGATIVE (mg/dL)   Protein, ur NEGATIVE  NEGATIVE (mg/dL)   Urobilinogen, UA 0.2  0.0 - 1.0 (mg/dL)   Nitrite NEGATIVE  NEGATIVE    Leukocytes, UA NEGATIVE  NEGATIVE      Lyanne Co, MD 07/07/11 0025

## 2011-07-08 DIAGNOSIS — Z48298 Encounter for aftercare following other organ transplant: Secondary | ICD-10-CM | POA: Diagnosis not present

## 2011-07-08 DIAGNOSIS — Z79899 Other long term (current) drug therapy: Secondary | ICD-10-CM | POA: Diagnosis not present

## 2011-07-08 DIAGNOSIS — Z940 Kidney transplant status: Secondary | ICD-10-CM | POA: Diagnosis not present

## 2011-07-15 DIAGNOSIS — Z48298 Encounter for aftercare following other organ transplant: Secondary | ICD-10-CM | POA: Diagnosis not present

## 2011-07-15 DIAGNOSIS — Z940 Kidney transplant status: Secondary | ICD-10-CM | POA: Diagnosis not present

## 2011-07-15 DIAGNOSIS — Z79899 Other long term (current) drug therapy: Secondary | ICD-10-CM | POA: Diagnosis not present

## 2011-07-22 DIAGNOSIS — Z940 Kidney transplant status: Secondary | ICD-10-CM | POA: Diagnosis not present

## 2011-07-22 DIAGNOSIS — Z79899 Other long term (current) drug therapy: Secondary | ICD-10-CM | POA: Diagnosis not present

## 2011-07-22 DIAGNOSIS — Z48298 Encounter for aftercare following other organ transplant: Secondary | ICD-10-CM | POA: Diagnosis not present

## 2011-07-29 DIAGNOSIS — Z48298 Encounter for aftercare following other organ transplant: Secondary | ICD-10-CM | POA: Diagnosis not present

## 2011-07-29 DIAGNOSIS — Z79899 Other long term (current) drug therapy: Secondary | ICD-10-CM | POA: Diagnosis not present

## 2011-07-29 DIAGNOSIS — Z940 Kidney transplant status: Secondary | ICD-10-CM | POA: Diagnosis not present

## 2011-08-26 DIAGNOSIS — Z79899 Other long term (current) drug therapy: Secondary | ICD-10-CM | POA: Diagnosis not present

## 2011-08-26 DIAGNOSIS — Z48298 Encounter for aftercare following other organ transplant: Secondary | ICD-10-CM | POA: Diagnosis not present

## 2011-08-26 DIAGNOSIS — Z940 Kidney transplant status: Secondary | ICD-10-CM | POA: Diagnosis not present

## 2011-11-17 DIAGNOSIS — I420 Dilated cardiomyopathy: Secondary | ICD-10-CM | POA: Insufficient documentation

## 2011-11-17 DIAGNOSIS — I472 Ventricular tachycardia: Secondary | ICD-10-CM | POA: Insufficient documentation

## 2011-11-17 DIAGNOSIS — J45909 Unspecified asthma, uncomplicated: Secondary | ICD-10-CM | POA: Insufficient documentation

## 2011-11-18 DIAGNOSIS — Z940 Kidney transplant status: Secondary | ICD-10-CM | POA: Diagnosis not present

## 2011-11-18 DIAGNOSIS — Z79899 Other long term (current) drug therapy: Secondary | ICD-10-CM | POA: Diagnosis not present

## 2012-01-04 ENCOUNTER — Other Ambulatory Visit (HOSPITAL_COMMUNITY): Payer: Self-pay | Admitting: Obstetrics and Gynecology

## 2012-01-04 DIAGNOSIS — Z1231 Encounter for screening mammogram for malignant neoplasm of breast: Secondary | ICD-10-CM

## 2012-01-13 DIAGNOSIS — I10 Essential (primary) hypertension: Secondary | ICD-10-CM | POA: Diagnosis not present

## 2012-01-13 DIAGNOSIS — Z23 Encounter for immunization: Secondary | ICD-10-CM | POA: Diagnosis not present

## 2012-01-13 DIAGNOSIS — Z48298 Encounter for aftercare following other organ transplant: Secondary | ICD-10-CM | POA: Diagnosis not present

## 2012-01-13 DIAGNOSIS — E213 Hyperparathyroidism, unspecified: Secondary | ICD-10-CM | POA: Diagnosis not present

## 2012-01-13 DIAGNOSIS — Z940 Kidney transplant status: Secondary | ICD-10-CM | POA: Diagnosis not present

## 2012-02-04 ENCOUNTER — Ambulatory Visit (HOSPITAL_COMMUNITY)
Admission: RE | Admit: 2012-02-04 | Discharge: 2012-02-04 | Disposition: A | Discharge status: Home / Self Care | Payer: 59 | Source: Ambulatory Visit | Attending: Obstetrics and Gynecology | Admitting: Obstetrics and Gynecology

## 2012-02-04 DIAGNOSIS — Z1231 Encounter for screening mammogram for malignant neoplasm of breast: Secondary | ICD-10-CM | POA: Insufficient documentation

## 2012-03-08 DIAGNOSIS — Z01419 Encounter for gynecological examination (general) (routine) without abnormal findings: Secondary | ICD-10-CM | POA: Diagnosis not present

## 2012-03-09 DIAGNOSIS — Z940 Kidney transplant status: Secondary | ICD-10-CM | POA: Diagnosis not present

## 2012-03-09 DIAGNOSIS — I10 Essential (primary) hypertension: Secondary | ICD-10-CM | POA: Diagnosis not present

## 2012-03-09 DIAGNOSIS — Z79899 Other long term (current) drug therapy: Secondary | ICD-10-CM | POA: Diagnosis not present

## 2012-04-07 DIAGNOSIS — Z79899 Other long term (current) drug therapy: Secondary | ICD-10-CM | POA: Diagnosis not present

## 2012-04-07 DIAGNOSIS — Z940 Kidney transplant status: Secondary | ICD-10-CM | POA: Diagnosis not present

## 2012-04-19 DIAGNOSIS — J9801 Acute bronchospasm: Secondary | ICD-10-CM | POA: Diagnosis not present

## 2012-04-19 DIAGNOSIS — J069 Acute upper respiratory infection, unspecified: Secondary | ICD-10-CM | POA: Diagnosis not present

## 2012-05-10 DIAGNOSIS — D649 Anemia, unspecified: Secondary | ICD-10-CM | POA: Diagnosis not present

## 2012-05-10 DIAGNOSIS — I10 Essential (primary) hypertension: Secondary | ICD-10-CM | POA: Diagnosis not present

## 2012-05-10 DIAGNOSIS — Z940 Kidney transplant status: Secondary | ICD-10-CM | POA: Diagnosis not present

## 2012-05-10 DIAGNOSIS — Z79899 Other long term (current) drug therapy: Secondary | ICD-10-CM | POA: Diagnosis not present

## 2012-06-07 DIAGNOSIS — Z79899 Other long term (current) drug therapy: Secondary | ICD-10-CM | POA: Diagnosis not present

## 2012-06-07 DIAGNOSIS — Z940 Kidney transplant status: Secondary | ICD-10-CM | POA: Diagnosis not present

## 2012-07-06 DIAGNOSIS — I10 Essential (primary) hypertension: Secondary | ICD-10-CM | POA: Diagnosis not present

## 2012-07-06 DIAGNOSIS — Z79899 Other long term (current) drug therapy: Secondary | ICD-10-CM | POA: Diagnosis not present

## 2012-07-06 DIAGNOSIS — Z940 Kidney transplant status: Secondary | ICD-10-CM | POA: Diagnosis not present

## 2012-07-06 DIAGNOSIS — E559 Vitamin D deficiency, unspecified: Secondary | ICD-10-CM | POA: Diagnosis not present

## 2012-07-14 DIAGNOSIS — J019 Acute sinusitis, unspecified: Secondary | ICD-10-CM | POA: Diagnosis not present

## 2012-07-14 DIAGNOSIS — N186 End stage renal disease: Secondary | ICD-10-CM | POA: Diagnosis not present

## 2012-07-28 DIAGNOSIS — B351 Tinea unguium: Secondary | ICD-10-CM | POA: Diagnosis not present

## 2012-08-04 DIAGNOSIS — I10 Essential (primary) hypertension: Secondary | ICD-10-CM | POA: Diagnosis not present

## 2012-08-04 DIAGNOSIS — D649 Anemia, unspecified: Secondary | ICD-10-CM | POA: Diagnosis not present

## 2012-08-04 DIAGNOSIS — Z79899 Other long term (current) drug therapy: Secondary | ICD-10-CM | POA: Diagnosis not present

## 2012-08-04 DIAGNOSIS — Z940 Kidney transplant status: Secondary | ICD-10-CM | POA: Diagnosis not present

## 2012-08-11 DIAGNOSIS — L820 Inflamed seborrheic keratosis: Secondary | ICD-10-CM | POA: Diagnosis not present

## 2012-09-13 DIAGNOSIS — L719 Rosacea, unspecified: Secondary | ICD-10-CM | POA: Diagnosis not present

## 2012-09-14 DIAGNOSIS — Z940 Kidney transplant status: Secondary | ICD-10-CM | POA: Diagnosis not present

## 2012-09-14 DIAGNOSIS — D649 Anemia, unspecified: Secondary | ICD-10-CM | POA: Diagnosis not present

## 2012-09-14 DIAGNOSIS — Z79899 Other long term (current) drug therapy: Secondary | ICD-10-CM | POA: Diagnosis not present

## 2012-10-14 DIAGNOSIS — Z79899 Other long term (current) drug therapy: Secondary | ICD-10-CM | POA: Diagnosis not present

## 2012-10-14 DIAGNOSIS — Z940 Kidney transplant status: Secondary | ICD-10-CM | POA: Diagnosis not present

## 2012-10-14 DIAGNOSIS — I10 Essential (primary) hypertension: Secondary | ICD-10-CM | POA: Diagnosis not present

## 2012-10-28 DIAGNOSIS — J069 Acute upper respiratory infection, unspecified: Secondary | ICD-10-CM | POA: Diagnosis not present

## 2012-11-11 DIAGNOSIS — Z940 Kidney transplant status: Secondary | ICD-10-CM | POA: Diagnosis not present

## 2012-11-11 DIAGNOSIS — D649 Anemia, unspecified: Secondary | ICD-10-CM | POA: Diagnosis not present

## 2012-11-11 DIAGNOSIS — Z79899 Other long term (current) drug therapy: Secondary | ICD-10-CM | POA: Diagnosis not present

## 2012-11-16 DIAGNOSIS — N390 Urinary tract infection, site not specified: Secondary | ICD-10-CM | POA: Diagnosis not present

## 2012-11-25 DIAGNOSIS — Z79899 Other long term (current) drug therapy: Secondary | ICD-10-CM | POA: Diagnosis not present

## 2012-11-25 DIAGNOSIS — Z940 Kidney transplant status: Secondary | ICD-10-CM | POA: Diagnosis not present

## 2013-01-03 ENCOUNTER — Other Ambulatory Visit: Payer: Self-pay

## 2013-01-03 DIAGNOSIS — Z1231 Encounter for screening mammogram for malignant neoplasm of breast: Secondary | ICD-10-CM

## 2013-01-11 DIAGNOSIS — Z940 Kidney transplant status: Secondary | ICD-10-CM | POA: Diagnosis not present

## 2013-01-11 DIAGNOSIS — Z79899 Other long term (current) drug therapy: Secondary | ICD-10-CM | POA: Diagnosis not present

## 2013-01-11 DIAGNOSIS — Z23 Encounter for immunization: Secondary | ICD-10-CM | POA: Diagnosis not present

## 2013-02-06 ENCOUNTER — Ambulatory Visit
Admission: RE | Admit: 2013-02-06 | Discharge: 2013-02-06 | Disposition: A | Discharge status: Home / Self Care | Payer: Medicare Other | Source: Ambulatory Visit

## 2013-02-06 DIAGNOSIS — Z1231 Encounter for screening mammogram for malignant neoplasm of breast: Secondary | ICD-10-CM

## 2013-02-28 DIAGNOSIS — Z124 Encounter for screening for malignant neoplasm of cervix: Secondary | ICD-10-CM | POA: Diagnosis not present

## 2013-02-28 DIAGNOSIS — Z01419 Encounter for gynecological examination (general) (routine) without abnormal findings: Secondary | ICD-10-CM | POA: Diagnosis not present

## 2013-03-02 DIAGNOSIS — J019 Acute sinusitis, unspecified: Secondary | ICD-10-CM | POA: Diagnosis not present

## 2013-03-09 DIAGNOSIS — Z79899 Other long term (current) drug therapy: Secondary | ICD-10-CM | POA: Diagnosis not present

## 2013-03-09 DIAGNOSIS — D649 Anemia, unspecified: Secondary | ICD-10-CM | POA: Diagnosis not present

## 2013-03-09 DIAGNOSIS — Z940 Kidney transplant status: Secondary | ICD-10-CM | POA: Diagnosis not present

## 2013-03-28 DIAGNOSIS — H10439 Chronic follicular conjunctivitis, unspecified eye: Secondary | ICD-10-CM | POA: Diagnosis not present

## 2013-04-06 DIAGNOSIS — E785 Hyperlipidemia, unspecified: Secondary | ICD-10-CM | POA: Diagnosis not present

## 2013-04-14 ENCOUNTER — Encounter: Payer: Self-pay | Admitting: Cardiology

## 2013-04-14 ENCOUNTER — Ambulatory Visit (INDEPENDENT_AMBULATORY_CARE_PROVIDER_SITE_OTHER): Payer: Medicare Other | Admitting: Cardiology

## 2013-04-14 ENCOUNTER — Encounter (INDEPENDENT_AMBULATORY_CARE_PROVIDER_SITE_OTHER): Payer: Self-pay

## 2013-04-14 VITALS — BP 100/70 | HR 86 | Ht 63.5 in | Wt 152.0 lb

## 2013-04-14 DIAGNOSIS — Z940 Kidney transplant status: Secondary | ICD-10-CM | POA: Diagnosis not present

## 2013-04-14 DIAGNOSIS — I420 Dilated cardiomyopathy: Secondary | ICD-10-CM

## 2013-04-14 DIAGNOSIS — I10 Essential (primary) hypertension: Secondary | ICD-10-CM

## 2013-04-14 DIAGNOSIS — E785 Hyperlipidemia, unspecified: Secondary | ICD-10-CM | POA: Diagnosis not present

## 2013-04-14 DIAGNOSIS — I428 Other cardiomyopathies: Secondary | ICD-10-CM

## 2013-04-14 DIAGNOSIS — Z79899 Other long term (current) drug therapy: Secondary | ICD-10-CM | POA: Diagnosis not present

## 2013-04-14 HISTORY — DX: Kidney transplant status: Z94.0

## 2013-04-14 HISTORY — DX: Dilated cardiomyopathy: I42.0

## 2013-04-14 MED ORDER — CARVEDILOL 6.25 MG PO TABS: 6.2500 mg | ORAL_TABLET | Freq: Two times a day (BID) | ORAL | Status: DC

## 2013-04-14 MED ORDER — LOSARTAN POTASSIUM 25 MG PO TABS: 12.5000 mg | ORAL_TABLET | Freq: Every day | ORAL | Status: DC

## 2013-04-14 NOTE — Patient Instructions (Signed)
Your physician has recommended you make the following change in your medication:  1. Decrease your Losartan to 12.5 Mg Once a day  2. Decrease your Coreg to 6.25 Twice a day Both Rx were sent into your Pharmacy, you should be able to pick them up today.  Keep an eye on your BP readings. Please let us know if your BP start to be above 140/90  Your physician wants you to follow-up in: 1 Year with Dr Algis Liming will receive a reminder letter in the mail two months in advance. If you don't receive a letter, please call our office to schedule the follow-up appointment.

## 2013-04-14 NOTE — Progress Notes (Signed)
[address] [place], [place]  [address] Phone: [phone] Fax:  [phone]  Date:  04/14/2013   ID:  Erin Ward, Erin Ward 1960/08/19, MRN [id number]  PCP:  Lupita Raider, MD   History of Present Illness: Erin Ward is a 52 y.o. female with nonischemic cardiomyopathy here for followup. She has undergone renal transplantation 2013. No coronary artery disease on cardiac catheterization. Echocardiogram on medical therapy shows a improvement in ejection fraction from 20% to in March of 2012, ejection fraction 55% at [facility].   Currently she has been noting lower blood pressures in the morning hours. She takes losartan 25 mg in the evening. This was started in May at [facility]. She sometimes skips her carvedilol because of lower blood pressures. On 04/14/13 I decided to decrease her carvedilol to 6.25 twice a day and her losartan to 12.5mg  at night. In the past, she has tried to increase her carvedilol to 25 mg twice a day but unsuccessful due to hypotension. She is very happy. No shortness of breath, no syncope. Try to lose weight. [place] [employer].    Wt Readings from Last 3 Encounters:  04/14/13 152 lb (68.947 kg)     Past Medical History  Diagnosis Date  . Renal disorder   . 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 Congestive dilated cardiomyopathy 04/14/2013    History of ejection fraction 20%, now normal at 55% in 2012 [facility] echocardiogram    Past Surgical History  Procedure Laterality Date  . Kidney transplant      Current Outpatient Prescriptions  Medication Sig Dispense Refill  . acetaminophen (TYLENOL) 500 MG tablet Take 500 mg by mouth every 6 (six) hours as needed. For pain      . aspirin 81 MG chewable tablet Chew 81 mg by mouth daily.      . carvedilol (COREG) 25 MG tablet Take 12.5 mg by mouth 2 (two) times daily with a meal.      . doxycycline (ORACEA) 40 MG capsule Take 40 mg by mouth  every morning.      Marland Kitchen losartan (COZAAR) 25 MG tablet Take 25 mg by mouth daily.      . Multiple Vitamins-Minerals (CENTRUM ADULTS PO) Take 1 tablet by mouth daily.      . mycophenolate (CELLCEPT) 250 MG capsule Take 1,000 mg by mouth 2 (two) times daily. At 9am and 9pm      . omeprazole (PRILOSEC) 20 MG capsule Take 20 mg by mouth daily.      . predniSONE (DELTASONE) 5 MG tablet Take 30 mg by mouth daily.      . sennosides-docusate sodium (SENOKOT-S) 8.6-50 MG tablet Take 2 tablets by mouth 2 (two) times daily. At 9am and 5pm      . tacrolimus (PROGRAF) 1 MG capsule Take 4 mg by mouth 2 (two) times daily. At 9am and 9pm      . venlafaxine (EFFEXOR-XR) 75 MG 24 hr capsule Take 150 mg by mouth daily.       No current facility-administered medications for this visit.    Allergies:   No Known Allergies  Social History:  The patient  reports that she has never smoked. She does not have any smokeless tobacco history on file. She reports that she does not drink alcohol or use illicit drugs.   ROS:  Please see the history of present illness.   Denies any bleeding, syncope, orthopnea,  PND  PHYSICAL EXAM: VS:  BP 100/70  Pulse 86  Ht 5' 3.5" (1.613 m)  Wt 152 lb (68.947 kg)  BMI 26.50 kg/m2 Well nourished, well developed, in no acute distress HEENT: normal Neck: no JVD Cardiac:  normal S1, S2; RRR; no murmur Lungs:  clear to auscultation bilaterally, no wheezing, rhonchi or rales Abd: soft, nontender, no hepatomegaly Ext: no edema Skin: warm and dry Neuro: no focal abnormalities noted  EKG:  Normal rhythm, 86, no other changes    ASSESSMENT AND PLAN:  1. Cardiomyopathy-at one point, ejection fraction was 20%, at [facility] in 2012, EF was 55%. Excellent improvement. 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 Nonetheless excellent improvement. Continue with carvedilol.  2. Hypertension-low bp in am. Takes losartan 25mg  at night. New in May 2014. We  will decrease losartan to 12.5 mg at night and decrease her carvedilol to 6.25 mg twice a day. Her previous hypertension is likely driven by her kidney disease. Continue with good control, multidrug regimen reviewed. Nephrology following, Dr. Lacy Duverney.  3. History of renal transplant-[facility] 2013. Excellent. Prograf. 4. She will let me know if any blood pressures become erratic. 1 year followup.  Signed, Donato Schultz, MD [facility]  04/14/2013 10:20 AM

## 2013-05-30 ENCOUNTER — Encounter (HOSPITAL_COMMUNITY): Payer: Self-pay | Admitting: Emergency Medicine

## 2013-05-30 ENCOUNTER — Emergency Department (HOSPITAL_COMMUNITY)
Admission: EM | Admit: 2013-05-30 | Discharge: 2013-05-31 | Disposition: A | Discharge status: Home / Self Care | Payer: Medicare Other | Attending: Emergency Medicine | Admitting: Emergency Medicine

## 2013-05-30 DIAGNOSIS — Z79899 Other long term (current) drug therapy: Secondary | ICD-10-CM | POA: Insufficient documentation

## 2013-05-30 DIAGNOSIS — Z87448 Personal history of other diseases of urinary system: Secondary | ICD-10-CM | POA: Diagnosis not present

## 2013-05-30 DIAGNOSIS — F329 Major depressive disorder, single episode, unspecified: Secondary | ICD-10-CM | POA: Insufficient documentation

## 2013-05-30 DIAGNOSIS — I10 Essential (primary) hypertension: Secondary | ICD-10-CM | POA: Insufficient documentation

## 2013-05-30 DIAGNOSIS — K5289 Other specified noninfective gastroenteritis and colitis: Secondary | ICD-10-CM | POA: Diagnosis not present

## 2013-05-30 DIAGNOSIS — K529 Noninfective gastroenteritis and colitis, unspecified: Secondary | ICD-10-CM

## 2013-05-30 DIAGNOSIS — Z7982 Long term (current) use of aspirin: Secondary | ICD-10-CM | POA: Diagnosis not present

## 2013-05-30 DIAGNOSIS — E7800 Pure hypercholesterolemia, unspecified: Secondary | ICD-10-CM | POA: Diagnosis not present

## 2013-05-30 DIAGNOSIS — F3289 Other specified depressive episodes: Secondary | ICD-10-CM | POA: Diagnosis not present

## 2013-05-30 DIAGNOSIS — D72829 Elevated white blood cell count, unspecified: Secondary | ICD-10-CM | POA: Diagnosis not present

## 2013-05-30 DIAGNOSIS — IMO0002 Reserved for concepts with insufficient information to code with codable children: Secondary | ICD-10-CM | POA: Diagnosis not present

## 2013-05-30 DIAGNOSIS — F411 Generalized anxiety disorder: Secondary | ICD-10-CM | POA: Diagnosis not present

## 2013-05-30 HISTORY — DX: Pure hypercholesterolemia, unspecified: E78.00

## 2013-05-30 HISTORY — DX: Major depressive disorder, single episode, unspecified: F32.9

## 2013-05-30 HISTORY — DX: Anxiety disorder, unspecified: F41.9

## 2013-05-30 HISTORY — DX: Essential (primary) hypertension: I10

## 2013-05-30 HISTORY — DX: Depression, unspecified: F32.A

## 2013-05-30 LAB — CBC WITH DIFFERENTIAL/PLATELET
Basophils Absolute: 0 10*3/uL (ref 0.0–0.1)
Basophils Relative: 0 % (ref 0–1)
Eosinophils Absolute: 0.2 10*3/uL (ref 0.0–0.7)
Eosinophils Relative: 2 % (ref 0–5)
HCT: 42.6 % (ref 36.0–46.0)
Hemoglobin: 14.5 g/dL (ref 12.0–15.0)
Lymphocytes Relative: 5 % — ABNORMAL LOW (ref 12–46)
Lymphs Abs: 0.6 10*3/uL — ABNORMAL LOW (ref 0.7–4.0)
MCH: 30.1 pg (ref 26.0–34.0)
MCHC: 34 g/dL (ref 30.0–36.0)
MCV: 88.6 fL (ref 78.0–100.0)
Monocytes Absolute: 0.7 10*3/uL (ref 0.1–1.0)
Monocytes Relative: 6 % (ref 3–12)
Neutro Abs: 10.2 10*3/uL — ABNORMAL HIGH (ref 1.7–7.7)
Neutrophils Relative %: 87 % — ABNORMAL HIGH (ref 43–77)
Platelets: 323 10*3/uL (ref 150–400)
RBC: 4.81 MIL/uL (ref 3.87–5.11)
RDW: 13.7 % (ref 11.5–15.5)
WBC: 11.7 10*3/uL — ABNORMAL HIGH (ref 4.0–10.5)

## 2013-05-30 LAB — COMPREHENSIVE METABOLIC PANEL
ALT: 22 U/L (ref 0–35)
AST: 20 U/L (ref 0–37)
Albumin: 4.5 g/dL (ref 3.5–5.2)
Alkaline Phosphatase: 64 U/L (ref 39–117)
BILIRUBIN TOTAL: 0.5 mg/dL (ref 0.3–1.2)
BUN: 25 mg/dL — AB (ref 6–23)
CHLORIDE: 101 meq/L (ref 96–112)
CO2: 19 mEq/L (ref 19–32)
Calcium: 9.7 mg/dL (ref 8.4–10.5)
Creatinine, Ser: 0.9 mg/dL (ref 0.50–1.10)
GFR, EST AFRICAN AMERICAN: 85 mL/min — AB (ref >90)
GFR, EST NON AFRICAN AMERICAN: 73 mL/min — AB (ref >90)
GLUCOSE: 148 mg/dL — AB (ref 70–99)
Potassium: 4 mEq/L (ref 3.7–5.3)
Sodium: 140 mEq/L (ref 137–147)
Total Protein: 7.1 g/dL (ref 6.0–8.3)

## 2013-05-30 MED ORDER — ONDANSETRON HCL 4 MG/2ML IJ SOLN
4.0000 mg | Freq: Once | INTRAMUSCULAR | Status: AC
  Administered 2013-05-30: 4 mg via INTRAVENOUS
  Filled 2013-05-30: qty 2

## 2013-05-30 MED ORDER — MORPHINE SULFATE 2 MG/ML IJ SOLN
2.0000 mg | Freq: Once | INTRAMUSCULAR | Status: AC
  Administered 2013-05-30: 2 mg via INTRAVENOUS
  Filled 2013-05-30: qty 1

## 2013-05-30 MED ORDER — ONDANSETRON 4 MG PO TBDP
8.0000 mg | ORAL_TABLET | Freq: Once | ORAL | Status: AC
  Administered 2013-05-30: 8 mg via ORAL
  Filled 2013-05-30: qty 2

## 2013-05-30 MED ORDER — SODIUM CHLORIDE 0.9 % IV BOLUS (SEPSIS)
2000.0000 mL | Freq: Once | INTRAVENOUS | Status: AC
  Administered 2013-05-30: 2000 mL via INTRAVENOUS

## 2013-05-30 NOTE — ED Notes (Signed)
Patient is a kidney transplant recipient from March 2013.  She advises her husband had been sick last week with diarrhea, nausea and vomiting.  The patient then started having the same symptoms today.  The patient took her immunosuppressant medications this morning but has not been able to keep anything else down.  The patient is supposed to take her second dose by ten and has not due to the nausea and vomiting.  The patient came because she was advised by her [place] doctors to come to the ED to be evaluated.

## 2013-05-30 NOTE — ED Notes (Signed)
IV team paged to bedside

## 2013-05-30 NOTE — ED Provider Notes (Signed)
CSN: [id number]     Arrival date & time 05/30/13  1949 History   First MD Initiated Contact with Patient 05/30/13 2214     Chief Complaint  Patient presents with  . Emesis  . Diarrhea     (Consider location/radiation/quality/duration/timing/severity/associated sxs/prior Treatment) Patient is a 53 y.o. female presenting with vomiting and diarrhea. The history is provided by the patient and medical records. No language interpreter was used.  Emesis Associated symptoms: abdominal pain ( epigastric soreness) and diarrhea   Diarrhea Associated symptoms: abdominal pain ( epigastric soreness) and vomiting   Associated symptoms: no diaphoresis and no fever     Erin Ward is a 53 y.o. female  with a hx of hypertension, anxiety, renal transplant (06/29/2011 at [facility]) presents to the Emergency Department complaining of gradual, persistent, progressively worsening nausea, vomiting and diarrhea onset this morning.  Patient reports she had diarrhea several times this morning. She reports she developed nausea later in the day and began vomiting approximately 6 PM. She states she's vomited approximately 10 times. She does not know how many times she's had diarrhea. She endorses nonbloody and nonbilious emesis. She also endorses watery, nonbloody diarrhea. She has not tried any over-the-counter medications.  Patient reports she has transplant medications that she must take tonight.  When they discussed patient's situation with her nephrologist at [facility] he recommended ED visit for fluids and Zofran so that she could take her medications.  Patient denies fever, chills, headache neck pain, chest pain, shortness of breath, abdominal pain, weakness, and dizziness, syncope.  Past Medical History  Diagnosis Date  . Renal disorder   . 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 Congestive dilated cardiomyopathy 04/14/2013    History of ejection fraction 20%,  now normal at 55% in 2012 [facility] echocardiogram  . Hypertension   . Hypercholesteremia   . Anxiety   . Depression    Past Surgical History  Procedure Laterality Date  . Kidney transplant     No family history on file. History  Substance Use Topics  . Smoking status: Never Smoker   . Smokeless tobacco: Not on file  . Alcohol Use: No   OB History   Grav Para Term Preterm Abortions TAB SAB Ect Mult Living                 Review of Systems  Constitutional: Negative for fever, diaphoresis, appetite change, fatigue and unexpected weight change.  HENT: Negative for mouth sores and trouble swallowing.   Respiratory: Negative for cough, chest tightness, shortness of breath, wheezing and stridor.   Cardiovascular: Negative for chest pain and palpitations.  Gastrointestinal: Positive for nausea, vomiting, abdominal pain ( epigastric soreness) and diarrhea. Negative for constipation, blood in stool, abdominal distention and rectal pain.  Genitourinary: Negative for dysuria, urgency, frequency, hematuria, flank pain and difficulty urinating.  Musculoskeletal: Negative for back pain, neck pain and neck stiffness.  Skin: Negative for rash.  Neurological: Negative for weakness.  Hematological: Negative for adenopathy.  Psychiatric/Behavioral: Negative for confusion.  All other systems reviewed and are negative.      Allergies  Review of patient's allergies indicates no known allergies.  Home Medications   Current Outpatient Rx  Name  Route  Sig  Dispense  Refill  . acetaminophen (TYLENOL) 500 MG tablet   Oral   Take 500 mg by mouth every 6 (six) hours as needed. For pain         .  aspirin 81 MG chewable tablet   Oral   Chew 81 mg by mouth daily.         . carvedilol (COREG) 6.25 MG tablet   Oral   Take 1 tablet (6.25 mg total) by mouth 2 (two) times daily with a meal.   60 tablet   5   . doxycycline (ORACEA) 40 MG capsule   Oral   Take 40 mg by mouth every morning.          . hydroxypropyl methylcellulose (ISOPTO TEARS) 2.5 % ophthalmic solution   Both Eyes   Place 1 drop into both eyes daily as needed for dry eyes.         Marland Kitchen losartan (COZAAR) 25 MG tablet   Oral   Take 25 mg by mouth daily.         . Multiple Vitamins-Minerals (CENTRUM ADULTS PO)   Oral   Take 1 tablet by mouth daily.         . mycophenolate (CELLCEPT) 250 MG capsule   Oral   Take 1,000 mg by mouth 2 (two) times daily. At 9am and 9pm         . omeprazole (PRILOSEC) 20 MG capsule   Oral   Take 20 mg by mouth daily.         . predniSONE (DELTASONE) 5 MG tablet   Oral   Take 5 mg by mouth daily.          . rosuvastatin (CRESTOR) 5 MG tablet   Oral   Take 5 mg by mouth daily.         . tacrolimus (PROGRAF) 1 MG capsule   Oral   Take 4 mg by mouth 2 (two) times daily. At 9am and 9pm         . venlafaxine (EFFEXOR-XR) 75 MG 24 hr capsule   Oral   Take 150 mg by mouth daily.         . ondansetron (ZOFRAN ODT) 8 MG disintegrating tablet      8mg  ODT q4 hours prn nausea   12 tablet   1    BP 137/95  Pulse 76  Temp(Src) 97.7 F (36.5 C) (Oral)  Resp 20  SpO2 100% Physical Exam  Nursing note and vitals reviewed. Constitutional: She is oriented to person, place, and time. She appears well-developed and well-nourished. No distress.  Awake, alert, nontoxic appearance  HENT:  Head: Normocephalic and atraumatic.  Mouth/Throat: Oropharynx is clear and moist. No oropharyngeal exudate.  Eyes: Conjunctivae are normal. No scleral icterus.  Neck: Normal range of motion. Neck supple.  Cardiovascular: Normal rate, regular rhythm, normal heart sounds and intact distal pulses.   No murmur heard. No tachycardia  Pulmonary/Chest: Effort normal and breath sounds normal. No respiratory distress. She has no wheezes.  Abdominal: Soft. Bowel sounds are normal. She exhibits no distension and no mass. There is tenderness (soreness, mild) in the epigastric area.  There is no rebound, no guarding and no CVA tenderness.    Abdomen with mild soreness in the epigastrium Soft without rebound or guarding No CVA tenderness  Musculoskeletal: Normal range of motion. She exhibits no edema.  Neurological: She is alert and oriented to person, place, and time. She exhibits normal muscle tone. Coordination normal.  Speech is clear and goal oriented Moves extremities without ataxia  Skin: Skin is warm and dry. No rash noted. She is not diaphoretic. No erythema.  Psychiatric: She has a normal mood and affect.  ED Course  Procedures (including critical care time) Labs Review Labs Reviewed  CBC WITH DIFFERENTIAL - Abnormal; Notable for the following:    WBC 11.7 (*)    Neutrophils Relative % 87 (*)    Neutro Abs 10.2 (*)    Lymphocytes Relative 5 (*)    Lymphs Abs 0.6 (*)    All other components within normal limits  COMPREHENSIVE METABOLIC PANEL - Abnormal; Notable for the following:    Glucose, Bld 148 (*)    BUN 25 (*)    GFR calc non Af Amer 73 (*)    GFR calc Af Amer 85 (*)    All other components within normal limits  URINALYSIS, ROUTINE W REFLEX MICROSCOPIC   Imaging Review No results found.  EKG Interpretation   None       MDM   Final diagnoses:  Gastroenteritis    Coralie Stanke Kundinger presents with epigastric soreness and nausea, vomiting and diarrhea.  Recent husband had similar symptoms several days ago.  She is alert, oriented, nontoxic, nonseptic appearing. She is afebrile and not tachycardic. Her vital signs are within normal limits.  CBC with mild leukocytosis of 11.7 and normal creatinine. Labs reassuring. Will give fluids, Zofran and allow her to take her medications.  2:13 AM Pt feeling much better and tolerating PO without difficulty.  Abdomen remains soft and nontender on repeat exam. Patient with symptoms consistent with viral gastroenteritis.  Vitals are stable, no fever.  Patient is nontoxic, nonseptic appearing, in no  apparent distress.  Patient does not meet the SIRS or Sepsis criteria.  Pt's symptoms have been managed in the department; fluid bolus given.  No signs of dehydration, tolerating PO fluids > 6 oz.  Lungs are clear.  No focal abdominal pain, no peritoneal signs, no concern for appendicitis, cholecystitis, pancreatitis, ruptured viscus, UTI, kidney stone or any other abdominal etiology.  Supportive therapy indicated with return if symptoms worsen.  Patient counseled.  I have also discussed reasons to return immediately to the ER.  Patient expresses understanding and agrees with plan.  It has been determined that no acute conditions requiring further emergency intervention are present at this time. The patient/guardian have been advised of the diagnosis and plan. We have discussed signs and symptoms that warrant return to the ED, such as changes or worsening in symptoms.   Vital signs are stable at discharge.   BP 137/95  Pulse 76  Temp(Src) 97.7 F (36.5 C) (Oral)  Resp 20  SpO2 100%  Patient/guardian has voiced understanding and agreed to follow-up with the PCP or specialist.          Abigail Butts, PA-C 05/31/13 0214

## 2013-05-30 NOTE — ED Notes (Signed)
Pt. reports diarrhea onset this morning and emesis this evening , denies fever or chills.

## 2013-05-31 MED ORDER — ONDANSETRON 8 MG PO TBDP: ORAL_TABLET | ORAL | Status: DC

## 2013-05-31 MED ORDER — ONDANSETRON HCL 4 MG/2ML IJ SOLN
INTRAMUSCULAR | Status: AC
  Filled 2013-05-31: qty 2

## 2013-05-31 NOTE — Discharge Instructions (Signed)
1. Medications: zofran, usual home medications 2. Treatment: rest, drink plenty of fluids,  3. Follow Up: Please followup with your primary doctor for discussion of your diagnoses and further evaluation after today's visit; if you do not have a primary care doctor use the resource guide provided to find one;   Viral Gastroenteritis Viral gastroenteritis is also known as stomach flu. This condition affects the stomach and intestinal tract. It can cause sudden diarrhea and vomiting. The illness typically lasts 3 to 8 days. Most people develop an immune response that eventually gets rid of the virus. While this natural response develops, the virus can make you quite ill. CAUSES  Many different viruses can cause gastroenteritis, such as rotavirus or noroviruses. You can catch one of these viruses by consuming contaminated food or water. You may also catch a virus by sharing utensils or other personal items with an infected person or by touching a contaminated surface. SYMPTOMS  The most common symptoms are diarrhea and vomiting. These problems can cause a severe loss of body fluids (dehydration) and a body salt (electrolyte) imbalance. Other symptoms may include:  Fever.  Headache.  Fatigue.  Abdominal pain. DIAGNOSIS  Your caregiver can usually diagnose viral gastroenteritis based on your symptoms and a physical exam. A stool sample may also be taken to test for the presence of viruses or other infections. TREATMENT  This illness typically goes away on its own. Treatments are aimed at rehydration. The most serious cases of viral gastroenteritis involve vomiting so severely that you are not able to keep fluids down. In these cases, fluids must be given through an intravenous line (IV). HOME CARE INSTRUCTIONS   Drink enough fluids to keep your urine clear or pale yellow. Drink small amounts of fluids frequently and increase the amounts as tolerated.  Ask your caregiver for specific rehydration  instructions.  Avoid:  Foods high in sugar.  Alcohol.  Carbonated drinks.  Tobacco.  Juice.  Caffeine drinks.  Extremely hot or cold fluids.  Fatty, greasy foods.  Too much intake of anything at one time.  Dairy products until 24 to 48 hours after diarrhea stops.  You may consume probiotics. Probiotics are active cultures of beneficial bacteria. They may lessen the amount and number of diarrheal stools in adults. Probiotics can be found in yogurt with active cultures and in supplements.  Wash your hands well to avoid spreading the virus.  Only take over-the-counter or prescription medicines for pain, discomfort, or fever as directed by your caregiver. Do not give aspirin to children. Antidiarrheal medicines are not recommended.  Ask your caregiver if you should continue to take your regular prescribed and over-the-counter medicines.  Keep all follow-up appointments as directed by your caregiver. SEEK IMMEDIATE MEDICAL CARE IF:   You are unable to keep fluids down.  You do not urinate at least once every 6 to 8 hours.  You develop shortness of breath.  You notice blood in your stool or vomit. This may look like coffee grounds.  You have abdominal pain that increases or is concentrated in one small area (localized).  You have persistent vomiting or diarrhea.  You have a fever.  The patient is a child younger than 3 months, and he or she has a fever.  The patient is a child older than 3 months, and he or she has a fever and persistent symptoms.  The patient is a child older than 3 months, and he or she has a fever and symptoms suddenly get  worse.  The patient is a baby, and he or she has no tears when crying. MAKE SURE YOU:   Understand these instructions.  Will watch your condition.  Will get help right away if you are not doing well or get worse. Document Released: 04/06/2005 Document Revised: 06/29/2011 Document Reviewed: 01/21/2011 [facility] Patient  Information 2014 Erin Ward.

## 2013-05-31 NOTE — ED Provider Notes (Signed)
Medical screening examination/treatment/procedure(s) were performed by non-physician practitioner and as supervising physician I was immediately available for consultation/collaboration.  EKG Interpretation   None         Ephraim Hamburger, MD 05/31/13 1055

## 2013-06-09 DIAGNOSIS — J209 Acute bronchitis, unspecified: Secondary | ICD-10-CM | POA: Diagnosis not present

## 2013-06-12 DIAGNOSIS — R062 Wheezing: Secondary | ICD-10-CM | POA: Diagnosis not present

## 2013-06-14 DIAGNOSIS — Z940 Kidney transplant status: Secondary | ICD-10-CM | POA: Diagnosis not present

## 2013-06-14 DIAGNOSIS — Z79899 Other long term (current) drug therapy: Secondary | ICD-10-CM | POA: Diagnosis not present

## 2013-06-14 DIAGNOSIS — D649 Anemia, unspecified: Secondary | ICD-10-CM | POA: Diagnosis not present

## 2013-07-03 DIAGNOSIS — J40 Bronchitis, not specified as acute or chronic: Secondary | ICD-10-CM | POA: Diagnosis not present

## 2013-07-03 DIAGNOSIS — J45991 Cough variant asthma: Secondary | ICD-10-CM | POA: Diagnosis not present

## 2013-07-03 DIAGNOSIS — Z940 Kidney transplant status: Secondary | ICD-10-CM | POA: Diagnosis not present

## 2013-07-12 DIAGNOSIS — Z940 Kidney transplant status: Secondary | ICD-10-CM | POA: Diagnosis not present

## 2013-07-20 DIAGNOSIS — B259 Cytomegaloviral disease, unspecified: Secondary | ICD-10-CM | POA: Diagnosis not present

## 2013-07-20 DIAGNOSIS — Z940 Kidney transplant status: Secondary | ICD-10-CM | POA: Diagnosis not present

## 2013-07-20 DIAGNOSIS — Z79899 Other long term (current) drug therapy: Secondary | ICD-10-CM | POA: Diagnosis not present

## 2013-08-18 DIAGNOSIS — B079 Viral wart, unspecified: Secondary | ICD-10-CM | POA: Diagnosis not present

## 2013-08-18 DIAGNOSIS — D485 Neoplasm of uncertain behavior of skin: Secondary | ICD-10-CM | POA: Diagnosis not present

## 2013-08-30 DIAGNOSIS — H2510 Age-related nuclear cataract, unspecified eye: Secondary | ICD-10-CM | POA: Diagnosis not present

## 2013-09-13 DIAGNOSIS — R0681 Apnea, not elsewhere classified: Secondary | ICD-10-CM | POA: Diagnosis not present

## 2013-09-13 DIAGNOSIS — I10 Essential (primary) hypertension: Secondary | ICD-10-CM | POA: Diagnosis not present

## 2013-09-28 DIAGNOSIS — I10 Essential (primary) hypertension: Secondary | ICD-10-CM | POA: Diagnosis not present

## 2013-09-28 DIAGNOSIS — R0681 Apnea, not elsewhere classified: Secondary | ICD-10-CM | POA: Diagnosis not present

## 2013-09-28 DIAGNOSIS — G4710 Hypersomnia, unspecified: Secondary | ICD-10-CM | POA: Diagnosis not present

## 2013-09-28 DIAGNOSIS — R069 Unspecified abnormalities of breathing: Secondary | ICD-10-CM | POA: Diagnosis not present

## 2013-10-03 DIAGNOSIS — I10 Essential (primary) hypertension: Secondary | ICD-10-CM | POA: Diagnosis not present

## 2013-10-03 DIAGNOSIS — Z79899 Other long term (current) drug therapy: Secondary | ICD-10-CM | POA: Diagnosis not present

## 2013-10-03 DIAGNOSIS — E785 Hyperlipidemia, unspecified: Secondary | ICD-10-CM | POA: Diagnosis not present

## 2013-10-03 DIAGNOSIS — Z940 Kidney transplant status: Secondary | ICD-10-CM | POA: Diagnosis not present

## 2013-10-18 DIAGNOSIS — L819 Disorder of pigmentation, unspecified: Secondary | ICD-10-CM | POA: Diagnosis not present

## 2013-10-18 DIAGNOSIS — D1801 Hemangioma of skin and subcutaneous tissue: Secondary | ICD-10-CM | POA: Diagnosis not present

## 2013-10-18 DIAGNOSIS — D2370 Other benign neoplasm of skin of unspecified lower limb, including hip: Secondary | ICD-10-CM | POA: Diagnosis not present

## 2013-10-18 DIAGNOSIS — D239 Other benign neoplasm of skin, unspecified: Secondary | ICD-10-CM | POA: Diagnosis not present

## 2013-10-18 DIAGNOSIS — L723 Sebaceous cyst: Secondary | ICD-10-CM | POA: Diagnosis not present

## 2013-10-18 DIAGNOSIS — L821 Other seborrheic keratosis: Secondary | ICD-10-CM | POA: Diagnosis not present

## 2014-01-04 ENCOUNTER — Other Ambulatory Visit: Payer: Self-pay | Admitting: Cardiology

## 2014-01-08 ENCOUNTER — Other Ambulatory Visit: Payer: Self-pay

## 2014-01-08 DIAGNOSIS — Z1231 Encounter for screening mammogram for malignant neoplasm of breast: Secondary | ICD-10-CM

## 2014-01-10 DIAGNOSIS — Z23 Encounter for immunization: Secondary | ICD-10-CM | POA: Diagnosis not present

## 2014-01-10 DIAGNOSIS — Z8639 Personal history of other endocrine, nutritional and metabolic disease: Secondary | ICD-10-CM | POA: Diagnosis not present

## 2014-01-10 DIAGNOSIS — Z940 Kidney transplant status: Secondary | ICD-10-CM | POA: Diagnosis not present

## 2014-01-10 DIAGNOSIS — Z48298 Encounter for aftercare following other organ transplant: Secondary | ICD-10-CM | POA: Diagnosis not present

## 2014-01-10 DIAGNOSIS — Z79899 Other long term (current) drug therapy: Secondary | ICD-10-CM | POA: Diagnosis not present

## 2014-01-10 DIAGNOSIS — I10 Essential (primary) hypertension: Secondary | ICD-10-CM | POA: Diagnosis not present

## 2014-01-25 DIAGNOSIS — N390 Urinary tract infection, site not specified: Secondary | ICD-10-CM | POA: Diagnosis not present

## 2014-02-07 ENCOUNTER — Ambulatory Visit: Payer: Medicare Other

## 2014-03-06 DIAGNOSIS — Z1231 Encounter for screening mammogram for malignant neoplasm of breast: Secondary | ICD-10-CM | POA: Diagnosis not present

## 2014-03-06 DIAGNOSIS — Z124 Encounter for screening for malignant neoplasm of cervix: Secondary | ICD-10-CM | POA: Diagnosis not present

## 2014-03-06 DIAGNOSIS — Z01419 Encounter for gynecological examination (general) (routine) without abnormal findings: Secondary | ICD-10-CM | POA: Diagnosis not present

## 2014-03-08 ENCOUNTER — Other Ambulatory Visit: Payer: Self-pay | Admitting: Cardiology

## 2014-03-19 DIAGNOSIS — J329 Chronic sinusitis, unspecified: Secondary | ICD-10-CM | POA: Diagnosis not present

## 2014-03-19 DIAGNOSIS — D899 Disorder involving the immune mechanism, unspecified: Secondary | ICD-10-CM | POA: Diagnosis not present

## 2014-03-19 DIAGNOSIS — D631 Anemia in chronic kidney disease: Secondary | ICD-10-CM | POA: Diagnosis not present

## 2014-03-19 DIAGNOSIS — J45991 Cough variant asthma: Secondary | ICD-10-CM | POA: Diagnosis not present

## 2014-03-19 DIAGNOSIS — Z940 Kidney transplant status: Secondary | ICD-10-CM | POA: Diagnosis not present

## 2014-03-19 DIAGNOSIS — I129 Hypertensive chronic kidney disease with stage 1 through stage 4 chronic kidney disease, or unspecified chronic kidney disease: Secondary | ICD-10-CM | POA: Diagnosis not present

## 2014-03-26 DIAGNOSIS — E785 Hyperlipidemia, unspecified: Secondary | ICD-10-CM | POA: Diagnosis not present

## 2014-03-26 DIAGNOSIS — Z940 Kidney transplant status: Secondary | ICD-10-CM | POA: Diagnosis not present

## 2014-03-26 DIAGNOSIS — N390 Urinary tract infection, site not specified: Secondary | ICD-10-CM | POA: Diagnosis not present

## 2014-03-26 DIAGNOSIS — I10 Essential (primary) hypertension: Secondary | ICD-10-CM | POA: Diagnosis not present

## 2014-03-29 DIAGNOSIS — Z940 Kidney transplant status: Secondary | ICD-10-CM | POA: Diagnosis not present

## 2014-03-29 DIAGNOSIS — M109 Gout, unspecified: Secondary | ICD-10-CM | POA: Diagnosis not present

## 2014-03-29 DIAGNOSIS — E785 Hyperlipidemia, unspecified: Secondary | ICD-10-CM | POA: Diagnosis not present

## 2014-03-29 DIAGNOSIS — N390 Urinary tract infection, site not specified: Secondary | ICD-10-CM | POA: Diagnosis not present

## 2014-04-24 DIAGNOSIS — Z940 Kidney transplant status: Secondary | ICD-10-CM | POA: Diagnosis not present

## 2014-05-05 ENCOUNTER — Other Ambulatory Visit: Payer: Self-pay | Admitting: Cardiology

## 2014-05-23 DIAGNOSIS — D235 Other benign neoplasm of skin of trunk: Secondary | ICD-10-CM | POA: Diagnosis not present

## 2014-05-23 DIAGNOSIS — D485 Neoplasm of uncertain behavior of skin: Secondary | ICD-10-CM | POA: Diagnosis not present

## 2014-05-25 DIAGNOSIS — L308 Other specified dermatitis: Secondary | ICD-10-CM | POA: Diagnosis not present

## 2014-07-06 DIAGNOSIS — H9011 Conductive hearing loss, unilateral, right ear, with unrestricted hearing on the contralateral side: Secondary | ICD-10-CM | POA: Diagnosis not present

## 2014-07-06 DIAGNOSIS — H7201 Central perforation of tympanic membrane, right ear: Secondary | ICD-10-CM | POA: Diagnosis not present

## 2014-07-11 DIAGNOSIS — F419 Anxiety disorder, unspecified: Secondary | ICD-10-CM | POA: Diagnosis not present

## 2014-07-11 DIAGNOSIS — Z79899 Other long term (current) drug therapy: Secondary | ICD-10-CM | POA: Diagnosis not present

## 2014-07-11 DIAGNOSIS — Z940 Kidney transplant status: Secondary | ICD-10-CM | POA: Diagnosis not present

## 2014-07-11 DIAGNOSIS — I472 Ventricular tachycardia: Secondary | ICD-10-CM | POA: Diagnosis not present

## 2014-07-11 DIAGNOSIS — I10 Essential (primary) hypertension: Secondary | ICD-10-CM | POA: Diagnosis not present

## 2014-07-11 DIAGNOSIS — I429 Cardiomyopathy, unspecified: Secondary | ICD-10-CM | POA: Diagnosis not present

## 2014-07-11 DIAGNOSIS — Z23 Encounter for immunization: Secondary | ICD-10-CM | POA: Diagnosis not present

## 2014-07-11 DIAGNOSIS — E213 Hyperparathyroidism, unspecified: Secondary | ICD-10-CM | POA: Diagnosis not present

## 2014-07-11 DIAGNOSIS — J45909 Unspecified asthma, uncomplicated: Secondary | ICD-10-CM | POA: Diagnosis not present

## 2014-07-11 DIAGNOSIS — D899 Disorder involving the immune mechanism, unspecified: Secondary | ICD-10-CM | POA: Diagnosis not present

## 2014-07-11 DIAGNOSIS — Z4822 Encounter for aftercare following kidney transplant: Secondary | ICD-10-CM | POA: Diagnosis not present

## 2014-07-11 DIAGNOSIS — F329 Major depressive disorder, single episode, unspecified: Secondary | ICD-10-CM | POA: Diagnosis not present

## 2014-07-24 DIAGNOSIS — Z940 Kidney transplant status: Secondary | ICD-10-CM | POA: Diagnosis not present

## 2014-08-06 DIAGNOSIS — M79672 Pain in left foot: Secondary | ICD-10-CM | POA: Diagnosis not present

## 2014-08-06 DIAGNOSIS — M25561 Pain in right knee: Secondary | ICD-10-CM | POA: Diagnosis not present

## 2014-08-07 ENCOUNTER — Telehealth: Payer: Self-pay | Admitting: *Deleted

## 2014-08-07 DIAGNOSIS — Z940 Kidney transplant status: Secondary | ICD-10-CM | POA: Diagnosis not present

## 2014-08-07 NOTE — Telephone Encounter (Signed)
-----   Message from Gardner sent at 08/07/2014  3:20 PM EDT ----- Regarding: RE: needs appt Just wanted to let you know that I called the pt and spoke to her about scheduling a f/u appt and she states that she doesn't wish to make an appt with him b/c [facility] follows her heart condition very closely.  Clarene Critchley   ----- Message -----    From: Shellia Cleverly, RN    Sent: 07/27/2014   9:00 AM      To: Marvia Pickles Respus Subject: needs appt                                     Pt is overdue for f/u with Dr Marlou Porch. Please call her to see if wants schedule f/u.  Thank You,  Pam :-)

## 2014-08-07 NOTE — Telephone Encounter (Signed)
Since pt is not seeing Dr Marlou Porch any longer she will need to have refills completed by MD at [facility].

## 2014-08-21 DIAGNOSIS — Z940 Kidney transplant status: Secondary | ICD-10-CM | POA: Diagnosis not present

## 2014-08-27 DIAGNOSIS — J342 Deviated nasal septum: Secondary | ICD-10-CM | POA: Diagnosis not present

## 2014-08-27 DIAGNOSIS — H7201 Central perforation of tympanic membrane, right ear: Secondary | ICD-10-CM | POA: Diagnosis not present

## 2014-08-27 DIAGNOSIS — H6983 Other specified disorders of Eustachian tube, bilateral: Secondary | ICD-10-CM | POA: Diagnosis not present

## 2014-08-27 DIAGNOSIS — J0101 Acute recurrent maxillary sinusitis: Secondary | ICD-10-CM | POA: Diagnosis not present

## 2014-08-27 DIAGNOSIS — H9071 Mixed conductive and sensorineural hearing loss, unilateral, right ear, with unrestricted hearing on the contralateral side: Secondary | ICD-10-CM | POA: Diagnosis not present

## 2014-08-28 DIAGNOSIS — Z940 Kidney transplant status: Secondary | ICD-10-CM | POA: Diagnosis not present

## 2014-08-28 DIAGNOSIS — Z5181 Encounter for therapeutic drug level monitoring: Secondary | ICD-10-CM | POA: Diagnosis not present

## 2014-09-06 DIAGNOSIS — Z5181 Encounter for therapeutic drug level monitoring: Secondary | ICD-10-CM | POA: Diagnosis not present

## 2014-09-06 DIAGNOSIS — Z940 Kidney transplant status: Secondary | ICD-10-CM | POA: Diagnosis not present

## 2014-09-18 DIAGNOSIS — J0101 Acute recurrent maxillary sinusitis: Secondary | ICD-10-CM | POA: Diagnosis not present

## 2014-09-18 DIAGNOSIS — J342 Deviated nasal septum: Secondary | ICD-10-CM | POA: Diagnosis not present

## 2014-09-18 DIAGNOSIS — J310 Chronic rhinitis: Secondary | ICD-10-CM | POA: Diagnosis not present

## 2014-09-27 DIAGNOSIS — Z940 Kidney transplant status: Secondary | ICD-10-CM | POA: Diagnosis not present

## 2014-09-27 DIAGNOSIS — Z5181 Encounter for therapeutic drug level monitoring: Secondary | ICD-10-CM | POA: Diagnosis not present

## 2014-10-11 DIAGNOSIS — Z5181 Encounter for therapeutic drug level monitoring: Secondary | ICD-10-CM | POA: Diagnosis not present

## 2014-10-11 DIAGNOSIS — Z940 Kidney transplant status: Secondary | ICD-10-CM | POA: Diagnosis not present

## 2014-10-18 DIAGNOSIS — I10 Essential (primary) hypertension: Secondary | ICD-10-CM | POA: Diagnosis not present

## 2014-10-18 DIAGNOSIS — E785 Hyperlipidemia, unspecified: Secondary | ICD-10-CM | POA: Diagnosis not present

## 2014-10-18 DIAGNOSIS — Z940 Kidney transplant status: Secondary | ICD-10-CM | POA: Diagnosis not present

## 2014-10-18 DIAGNOSIS — N390 Urinary tract infection, site not specified: Secondary | ICD-10-CM | POA: Diagnosis not present

## 2014-10-25 DIAGNOSIS — Z5181 Encounter for therapeutic drug level monitoring: Secondary | ICD-10-CM | POA: Diagnosis not present

## 2014-10-25 DIAGNOSIS — Z940 Kidney transplant status: Secondary | ICD-10-CM | POA: Diagnosis not present

## 2014-11-08 DIAGNOSIS — Z940 Kidney transplant status: Secondary | ICD-10-CM | POA: Diagnosis not present

## 2014-11-08 DIAGNOSIS — Z5181 Encounter for therapeutic drug level monitoring: Secondary | ICD-10-CM | POA: Diagnosis not present

## 2014-11-15 DIAGNOSIS — S61214A Laceration without foreign body of right ring finger without damage to nail, initial encounter: Secondary | ICD-10-CM | POA: Diagnosis not present

## 2014-11-21 DIAGNOSIS — H04123 Dry eye syndrome of bilateral lacrimal glands: Secondary | ICD-10-CM | POA: Diagnosis not present

## 2014-11-22 DIAGNOSIS — Z940 Kidney transplant status: Secondary | ICD-10-CM | POA: Diagnosis not present

## 2014-11-22 DIAGNOSIS — Z5181 Encounter for therapeutic drug level monitoring: Secondary | ICD-10-CM | POA: Diagnosis not present

## 2014-12-14 DIAGNOSIS — M25561 Pain in right knee: Secondary | ICD-10-CM | POA: Diagnosis not present

## 2014-12-14 DIAGNOSIS — M79642 Pain in left hand: Secondary | ICD-10-CM | POA: Diagnosis not present

## 2014-12-20 DIAGNOSIS — Z940 Kidney transplant status: Secondary | ICD-10-CM | POA: Diagnosis not present

## 2014-12-20 DIAGNOSIS — Z5181 Encounter for therapeutic drug level monitoring: Secondary | ICD-10-CM | POA: Diagnosis not present

## 2015-01-01 DIAGNOSIS — H6983 Other specified disorders of Eustachian tube, bilateral: Secondary | ICD-10-CM | POA: Diagnosis not present

## 2015-01-01 DIAGNOSIS — H7201 Central perforation of tympanic membrane, right ear: Secondary | ICD-10-CM | POA: Diagnosis not present

## 2015-01-09 DIAGNOSIS — N2581 Secondary hyperparathyroidism of renal origin: Secondary | ICD-10-CM | POA: Diagnosis not present

## 2015-01-09 DIAGNOSIS — Z940 Kidney transplant status: Secondary | ICD-10-CM | POA: Diagnosis not present

## 2015-01-15 DIAGNOSIS — H6983 Other specified disorders of Eustachian tube, bilateral: Secondary | ICD-10-CM | POA: Diagnosis not present

## 2015-01-15 DIAGNOSIS — H9011 Conductive hearing loss, unilateral, right ear, with unrestricted hearing on the contralateral side: Secondary | ICD-10-CM | POA: Diagnosis not present

## 2015-01-23 ENCOUNTER — Other Ambulatory Visit: Payer: Self-pay | Admitting: Orthopedic Surgery

## 2015-01-23 DIAGNOSIS — M25561 Pain in right knee: Secondary | ICD-10-CM

## 2015-01-29 DIAGNOSIS — Z23 Encounter for immunization: Secondary | ICD-10-CM | POA: Diagnosis not present

## 2015-02-07 DIAGNOSIS — Z5181 Encounter for therapeutic drug level monitoring: Secondary | ICD-10-CM | POA: Diagnosis not present

## 2015-02-07 DIAGNOSIS — Z940 Kidney transplant status: Secondary | ICD-10-CM | POA: Diagnosis not present

## 2015-02-08 ENCOUNTER — Ambulatory Visit
Admission: RE | Admit: 2015-02-08 | Discharge: 2015-02-08 | Disposition: A | Discharge status: Home / Self Care | Payer: Medicare Other | Source: Ambulatory Visit | Attending: Orthopedic Surgery | Admitting: Orthopedic Surgery

## 2015-02-08 DIAGNOSIS — M25561 Pain in right knee: Secondary | ICD-10-CM | POA: Diagnosis not present

## 2015-02-14 DIAGNOSIS — M25561 Pain in right knee: Secondary | ICD-10-CM | POA: Diagnosis not present

## 2015-03-06 DIAGNOSIS — Z940 Kidney transplant status: Secondary | ICD-10-CM | POA: Diagnosis not present

## 2015-03-06 DIAGNOSIS — E785 Hyperlipidemia, unspecified: Secondary | ICD-10-CM | POA: Diagnosis not present

## 2015-03-06 DIAGNOSIS — Z79899 Other long term (current) drug therapy: Secondary | ICD-10-CM | POA: Diagnosis not present

## 2015-03-06 DIAGNOSIS — N390 Urinary tract infection, site not specified: Secondary | ICD-10-CM | POA: Diagnosis not present

## 2015-03-06 DIAGNOSIS — I10 Essential (primary) hypertension: Secondary | ICD-10-CM | POA: Diagnosis not present

## 2015-03-10 NOTE — Progress Notes (Signed)
Cardiology Office Note   Date:  03/11/2015   ID:  Erin Ward, DOB Aug 20, 1960, MRN [id number]  PCP:  Mayra Neer, MD  Cardiologist:  Dr. Marlou Porch    Chief Complaint  Patient presents with  . Pre-op Exam    no chest pain, no SOB      History of Present Illness: Erin Ward is a 54 y.o. female who presents for pre-op clearance for arthroscopic decompression of ACL cyst on rt knee. Plan for general anesthesia.   She has a hx of nonischemic cardiomyopathy. She has undergone renal transplantation 2013. No coronary artery disease on cardiac catheterization in 2011. Echocardiogram on medical therapy shows a improvement in ejection fraction from 20% to in March of 2012, ejection fraction 55% at [facility] in 2012.   She has been followed by [facility] but with need for surgery is back to follow with Dr. Marlou Porch.    She has no chest pain, no SOB and no edema.  She is able to do her ADLs without problems.      Past Medical History  Diagnosis Date  . Renal disorder   . 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 Congestive dilated cardiomyopathy ([place]) 04/14/2013    History of ejection fraction 20%, now normal at 55% in 2012 [facility] echocardiogram  . Hypertension   . Hypercholesteremia   . Anxiety   . Depression     Past Surgical History  Procedure Laterality Date  . Kidney transplant       Current Outpatient Prescriptions  Medication Sig Dispense Refill  . acetaminophen (TYLENOL) 500 MG tablet Take 500 mg by mouth every 6 (six) hours as needed. For pain    . aspirin 81 MG chewable tablet Chew 81 mg by mouth daily.    . carvedilol (COREG) 25 MG tablet Take 37.5 mg by mouth 2 (two) times daily with a meal.    . losartan (COZAAR) 50 MG tablet Take 50 mg by mouth daily.    . Multiple Vitamins-Minerals (CENTRUM ADULTS PO) Take 1 tablet by mouth daily.    Marland Kitchen omeprazole (PRILOSEC) 20 MG capsule Take 20 mg by mouth daily.    .  predniSONE (DELTASONE) 5 MG tablet Take 5 mg by mouth daily.     . rosuvastatin (CRESTOR) 5 MG tablet Take 5 mg by mouth daily.    . Sirolimus 0.5 MG TABS Take 1 tablet by mouth daily.    Marland Kitchen venlafaxine XR (EFFEXOR XR) 150 MG 24 hr capsule Take 150 mg by mouth daily with breakfast.     No current facility-administered medications for this visit.    Allergies:   Hydrocodone-acetaminophen and Lisinopril    Social History:  The patient  reports that she has never smoked. She does not have any smokeless tobacco history on file. She reports that she does not drink alcohol or use illicit drugs.   Family History:  The patient's family history includes Hypertension in her mother.    ROS:  General:no colds or fevers, weight changes Skin:no rashes or ulcers HEENT:no blurred vision, no congestion CV:see HPI PUL:see HPI GI:no diarrhea constipation or melena, no indigestion GU:no hematuria, no dysuria MS:no joint pain, no claudication Neuro:no syncope, no lightheadedness Endo:no diabetes, no thyroid disease  Wt Readings from Last 3 Encounters:  03/11/15 164 lb (74.39 kg)  04/14/13 152 lb (68.947 kg)     PHYSICAL EXAM: VS:  BP 112/64 mmHg  Pulse 66  Ht 5' 3.5" (1.613  m)  Wt 164 lb (74.39 kg)  BMI 28.59 kg/m2 , BMI Body mass index is 28.59 kg/(m^2). General:Pleasant affect, NAD Skin:Warm and dry, brisk capillary refill HEENT:normocephalic, sclera clear, mucus membranes moist Neck:supple, no JVD, no bruits  Heart:S1S2 RRR without murmur, gallup, rub or click Lungs:clear without rales, rhonchi, or wheezes [id number], non tender, + BS, do not palpate liver spleen or masses Ext:no lower ext edema, 2+ pedal pulses, 2+ radial pulses Neuro:alert and oriented X 3, MAE, follows commands, + facial symmetry    EKG:  EKG is ordered today. The ekg ordered today demonstrates SR normal EKG and no changes from 2014.   Recent Labs: No results found for requested labs within last 365 days.     Lipid Panel No results found for: CHOL, TRIG, HDL, CHOLHDL, VLDL, LDLCALC, LDLDIRECT     Other studies Reviewed: Additional studies/ records that were reviewed today include: echo at [place] in 2012 with RF 55.%   ASSESSMENT AND PLAN:  1.  Hx NICM improved on echo at [facility]. Now 55%. No symptoms of heart failure.  No CAD and no chest pain.    2. Cardiac risk per Truman Hayward scale no high risk surgery, no CAD, + hx of LV dysfunction but now resolved, Cr < 2.  = Low Risk for adverse outcome  Rate of MI, Pul edema, V fib cardiac arrest or CHB is 0.9%.  Reviewed with Dr. Caryl Comes DOD.   3. Hx kidney transplant followed at [facility] and with Dr. Moshe Cipro.     [facility] follows her meds, and care.  She will follow up with Dr. Marlou Porch in 1 year.     Current medicines are reviewed with the patient today.  The patient Has no concerns regarding medicines.  The following changes have been made:  See above Labs/ tests ordered today include:see above  Disposition:   FU:  see above  Signed, Isaiah Serge, NP  03/11/2015 8:45 AM    [address], [place], [place] Phone: [phone]; Fax: [phone]

## 2015-03-11 ENCOUNTER — Encounter: Payer: Self-pay | Admitting: Cardiology

## 2015-03-11 ENCOUNTER — Ambulatory Visit (INDEPENDENT_AMBULATORY_CARE_PROVIDER_SITE_OTHER): Payer: Medicare Other | Admitting: Cardiology

## 2015-03-11 VITALS — BP 112/64 | HR 66 | Ht 63.5 in | Wt 164.0 lb

## 2015-03-11 DIAGNOSIS — I10 Essential (primary) hypertension: Secondary | ICD-10-CM

## 2015-03-11 DIAGNOSIS — Z940 Kidney transplant status: Secondary | ICD-10-CM | POA: Diagnosis not present

## 2015-03-11 DIAGNOSIS — F329 Major depressive disorder, single episode, unspecified: Secondary | ICD-10-CM | POA: Insufficient documentation

## 2015-03-11 DIAGNOSIS — Z8679 Personal history of other diseases of the circulatory system: Secondary | ICD-10-CM | POA: Diagnosis not present

## 2015-03-11 DIAGNOSIS — M25861 Other specified joint disorders, right knee: Secondary | ICD-10-CM | POA: Diagnosis not present

## 2015-03-11 DIAGNOSIS — E213 Hyperparathyroidism, unspecified: Secondary | ICD-10-CM | POA: Insufficient documentation

## 2015-03-11 DIAGNOSIS — Z9889 Other specified postprocedural states: Secondary | ICD-10-CM

## 2015-03-11 DIAGNOSIS — F32A Depression, unspecified: Secondary | ICD-10-CM | POA: Insufficient documentation

## 2015-03-11 DIAGNOSIS — M7138 Other bursal cyst, other site: Secondary | ICD-10-CM | POA: Diagnosis not present

## 2015-03-11 NOTE — Patient Instructions (Signed)
Medication Instructions:  None  Labwork: None  Testing/Procedures: None  Follow-Up: Your physician wants you to follow-up in: 1 year with Dr. Marlou Porch. You will receive a reminder letter in the mail two months in advance. If you don't receive a letter, please call our office to schedule the follow-up appointment.   Any Other Special Instructions Will Be Listed Below (If Applicable).  You are at low risk from a cardiac standpoint for your surgery.   If you need a refill on your cardiac medications before your next appointment, please call your pharmacy.

## 2015-04-03 DIAGNOSIS — R87610 Atypical squamous cells of undetermined significance on cytologic smear of cervix (ASC-US): Secondary | ICD-10-CM | POA: Diagnosis not present

## 2015-04-03 DIAGNOSIS — Z1231 Encounter for screening mammogram for malignant neoplasm of breast: Secondary | ICD-10-CM | POA: Diagnosis not present

## 2015-04-03 DIAGNOSIS — Z01419 Encounter for gynecological examination (general) (routine) without abnormal findings: Secondary | ICD-10-CM | POA: Diagnosis not present

## 2015-04-03 DIAGNOSIS — Z124 Encounter for screening for malignant neoplasm of cervix: Secondary | ICD-10-CM | POA: Diagnosis not present

## 2015-04-09 DIAGNOSIS — Z5181 Encounter for therapeutic drug level monitoring: Secondary | ICD-10-CM | POA: Diagnosis not present

## 2015-04-09 DIAGNOSIS — Z940 Kidney transplant status: Secondary | ICD-10-CM | POA: Diagnosis not present

## 2015-04-25 DIAGNOSIS — J069 Acute upper respiratory infection, unspecified: Secondary | ICD-10-CM | POA: Diagnosis not present

## 2015-05-09 DIAGNOSIS — E785 Hyperlipidemia, unspecified: Secondary | ICD-10-CM | POA: Diagnosis not present

## 2015-05-09 DIAGNOSIS — Z940 Kidney transplant status: Secondary | ICD-10-CM | POA: Diagnosis not present

## 2015-05-09 DIAGNOSIS — Z5181 Encounter for therapeutic drug level monitoring: Secondary | ICD-10-CM | POA: Diagnosis not present

## 2015-06-07 DIAGNOSIS — M79642 Pain in left hand: Secondary | ICD-10-CM | POA: Diagnosis not present

## 2015-06-12 DIAGNOSIS — Z5181 Encounter for therapeutic drug level monitoring: Secondary | ICD-10-CM | POA: Diagnosis not present

## 2015-06-12 DIAGNOSIS — Z940 Kidney transplant status: Secondary | ICD-10-CM | POA: Diagnosis not present

## 2015-06-14 DIAGNOSIS — H00012 Hordeolum externum right lower eyelid: Secondary | ICD-10-CM | POA: Diagnosis not present

## 2015-06-24 DIAGNOSIS — G5602 Carpal tunnel syndrome, left upper limb: Secondary | ICD-10-CM | POA: Diagnosis not present

## 2015-07-08 DIAGNOSIS — M65332 Trigger finger, left middle finger: Secondary | ICD-10-CM | POA: Diagnosis not present

## 2015-07-09 DIAGNOSIS — Z940 Kidney transplant status: Secondary | ICD-10-CM | POA: Diagnosis not present

## 2015-07-09 DIAGNOSIS — N2581 Secondary hyperparathyroidism of renal origin: Secondary | ICD-10-CM | POA: Diagnosis not present

## 2015-08-13 DIAGNOSIS — Z940 Kidney transplant status: Secondary | ICD-10-CM | POA: Diagnosis not present

## 2015-08-13 DIAGNOSIS — Z5181 Encounter for therapeutic drug level monitoring: Secondary | ICD-10-CM | POA: Diagnosis not present

## 2015-08-19 DIAGNOSIS — J069 Acute upper respiratory infection, unspecified: Secondary | ICD-10-CM | POA: Diagnosis not present

## 2015-08-28 DIAGNOSIS — Z5181 Encounter for therapeutic drug level monitoring: Secondary | ICD-10-CM | POA: Diagnosis not present

## 2015-08-28 DIAGNOSIS — Z940 Kidney transplant status: Secondary | ICD-10-CM | POA: Diagnosis not present

## 2015-09-04 DIAGNOSIS — E785 Hyperlipidemia, unspecified: Secondary | ICD-10-CM | POA: Diagnosis not present

## 2015-09-04 DIAGNOSIS — I10 Essential (primary) hypertension: Secondary | ICD-10-CM | POA: Diagnosis not present

## 2015-09-04 DIAGNOSIS — N390 Urinary tract infection, site not specified: Secondary | ICD-10-CM | POA: Diagnosis not present

## 2015-09-04 DIAGNOSIS — Z79899 Other long term (current) drug therapy: Secondary | ICD-10-CM | POA: Diagnosis not present

## 2015-09-04 DIAGNOSIS — Z940 Kidney transplant status: Secondary | ICD-10-CM | POA: Diagnosis not present

## 2015-09-25 DIAGNOSIS — Z5181 Encounter for therapeutic drug level monitoring: Secondary | ICD-10-CM | POA: Diagnosis not present

## 2015-09-25 DIAGNOSIS — Z940 Kidney transplant status: Secondary | ICD-10-CM | POA: Diagnosis not present

## 2015-10-09 ENCOUNTER — Ambulatory Visit (INDEPENDENT_AMBULATORY_CARE_PROVIDER_SITE_OTHER): Payer: Medicare Other | Admitting: Podiatry

## 2015-10-09 ENCOUNTER — Encounter: Payer: Self-pay | Admitting: Podiatry

## 2015-10-09 VITALS — BP 111/59 | HR 63 | Resp 14

## 2015-10-09 DIAGNOSIS — L03032 Cellulitis of left toe: Secondary | ICD-10-CM | POA: Diagnosis not present

## 2015-10-09 DIAGNOSIS — L02612 Cutaneous abscess of left foot: Secondary | ICD-10-CM

## 2015-10-09 MED ORDER — DOXYCYCLINE HYCLATE 100 MG PO TABS: 100.0000 mg | ORAL_TABLET | Freq: Two times a day (BID) | ORAL | Status: DC

## 2015-10-09 NOTE — Addendum Note (Signed)
Addended by: Ezzard Flax, Amirr Achord L on: 10/09/2015 10:13 AM   Modules accepted: Orders

## 2015-10-09 NOTE — Addendum Note (Signed)
Addended by: Ezzard Flax, Ephrata Verville L on: 10/09/2015 10:16 AM   Modules accepted: Orders

## 2015-10-09 NOTE — Progress Notes (Addendum)
   Subjective:    Patient ID: Erin Ward, female    DOB: 13-Aug-1960, 55 y.o.   MRN: [id number]  HPI this patient presents to the office stating that she has developed a red, swollen fourth toe left foot. He states that this toe started out itching and then  painful and then red over the last 4 days. She denies any incidents that may have led to this, redness, except for the fact that there was a tick found on her foot this past last week.  She says she also exercises in the gym and wears sandals in the gym. She denies any drainage noted from the fourth toe left foot. She has provided no self treatment nor sought any professional help. She presents the office today for an evaluation and treatment of this condition    Review of Systems  All other systems reviewed and are negative.      Objective:   Physical Exam GENERAL APPEARANCE: Alert, conversant. Appropriately groomed. No acute distress.  VASCULAR: Pedal pulses are  palpable at  [facility] and PT bilateral.  Capillary refill time is immediate to all digits,  Normal temperature gradient.  Digital hair growth is present bilateral  NEUROLOGIC: sensation is normal to 5.07 monofilament at 5/5 sites bilateral.  Light touch is intact bilateral, Muscle strength normal.  MUSCULOSKELETAL: acceptable muscle strength, tone and stability bilateral.  Intrinsic muscluature intact bilateral.  Rectus appearance of foot and digits noted bilateral.   DERMATOLOGIC: skin color, texture, and turgor are within normal limits.  No preulcerative lesions or ulcers  are seen, no interdigital maceration noted.  No open lesions present.  Digital nails are asymptomatic. No drainage noted. There is red nails and swelling noted to the fourth toe of the left foot, especially on the medial aspect of the fourth toe. There is no drainage or streaking noted from the site. This area is not extremely hot or inflamed         Assessment & Plan:  Localized cellulitis fourth toe left  foot.  Possible erythrasma.    IE  Examination of her fourth toe does reveal redness and swelling on the medial aspect. Examination of her toe reveals no break of skin which could have led to a cellulitis. She may also be experiencing erythrasma in this third interdigital space. Finally, the site looks more inflamed as opposed to infectious. This patient would be a good patient for a x-ray to be taken which is not present at the [place] office. She was prescribed doxycycline to treat the possible cellulitis as well as given a note for her to pick up Lamisil AF and applied to her toe. She was instructed to call back on Friday. If the problem persists and I will work on getting her seen at [place] and x-rays taken. If not, she will be followed up in one week at the [place] office.  Gardiner Barefoot DPM

## 2015-10-10 ENCOUNTER — Telehealth: Payer: Self-pay | Admitting: *Deleted

## 2015-10-10 NOTE — Telephone Encounter (Signed)
Pt states she saw Dr. Prudence Davidson yesterday and was given topical and oral antibiotics for her 4th toe, but it is looking worse. Pt states it looks worse since using the Lamisil AF, I told pt to stop the cream.Transferred to schedulers to get pt into be seen tomorrow.

## 2015-10-11 ENCOUNTER — Telehealth: Payer: Self-pay | Admitting: *Deleted

## 2015-10-11 ENCOUNTER — Ambulatory Visit (INDEPENDENT_AMBULATORY_CARE_PROVIDER_SITE_OTHER): Payer: Medicare Other | Admitting: Podiatry

## 2015-10-11 ENCOUNTER — Encounter: Payer: Self-pay | Admitting: Podiatry

## 2015-10-11 VITALS — BP 111/54 | HR 70 | Resp 14

## 2015-10-11 DIAGNOSIS — L03032 Cellulitis of left toe: Secondary | ICD-10-CM | POA: Diagnosis not present

## 2015-10-11 DIAGNOSIS — L02612 Cutaneous abscess of left foot: Secondary | ICD-10-CM

## 2015-10-11 DIAGNOSIS — L309 Dermatitis, unspecified: Secondary | ICD-10-CM

## 2015-10-11 LAB — CBC WITH DIFFERENTIAL/PLATELET
Basophils Absolute: 0 10*3/uL (ref 0.0–0.2)
Basos: 0 %
EOS (ABSOLUTE): 0.5 10*3/uL — ABNORMAL HIGH (ref 0.0–0.4)
Eos: 7 %
HEMATOCRIT: 38.7 % (ref 34.0–46.6)
HEMOGLOBIN: 13.2 g/dL (ref 11.1–15.9)
LYMPHS ABS: 1.9 10*3/uL (ref 0.7–3.1)
Lymphs: 25 %
MCH: 28.1 pg (ref 26.6–33.0)
MCHC: 34.1 g/dL (ref 31.5–35.7)
MCV: 82 fL (ref 79–97)
MONOCYTES: 12 %
Monocytes Absolute: 0.9 10*3/uL (ref 0.1–0.9)
NEUTROS PCT: 56 %
Neutrophils Absolute: 4.3 10*3/uL (ref 1.4–7.0)
Platelets: 288 10*3/uL (ref 150–379)
RBC: 4.7 x10E6/uL (ref 3.77–5.28)
RDW: 14.7 % (ref 12.3–15.4)
WBC: 7.6 10*3/uL (ref 3.4–10.8)

## 2015-10-11 NOTE — Progress Notes (Signed)
Subjective:     Patient ID: Erin Ward, female   DOB: 01-18-1961, 55 y.o.   MRN: [id number]  HPI this patient presents to the office saying that her fourth toe left foot is looking worse. She was seen in the [facility] office for a red, inflamed fourth toe left foot. She described that it aches at first and then it became red and inflamed. I prescribed doxycycline for her to take by mouth as well as told her to put Lamisil AF on the toe. She states that he still is not any drainage or significant pain noted at the site. I had told her that if it had not improved to come Friday and an x-ray will be taken. She presents today stating that it looks worse to her and presents for an evaluation   Review of Systems     Objective:   Physical Exam GENERAL APPEARANCE: Alert, conversant. Appropriately groomed. No acute distress.  VASCULAR: Pedal pulses are  palpable at  [facility] and PT bilateral.  Capillary refill time is immediate to all digits,  Normal temperature gradient.  Digital hair growth is present bilateral  NEUROLOGIC: sensation is normal to 5.07 monofilament at 5/5 sites bilateral.  Light touch is intact bilateral, Muscle strength normal.  MUSCULOSKELETAL: acceptable muscle strength, tone and stability bilateral.  Intrinsic muscluature intact bilateral.  Rectus appearance of foot and digits noted bilateral.   DERMATOLOGIC: Examination of the fourth toe reveals a red, inflamed area nail has a purplish hue. Noted.  The swelling in the fourth toe has resolved. There is still is no evidence of any streaking noted from the site      Assessment:     Dermatitis /  Cellulitis fourth toe left foot.     Plan:     ROV  My examination of the inflamed area from Wednesday does appear improved. The redness is no longer the pink redness, but rather a bluish/purple discoloration, swelling the fourth toe has resolved. No increased temperature is noted at the site. Dr. Jacqualyn Posey evaluated this patient and  recommended. We will request a CBC with differential and to keep on the antibiotics. She is to return to the office in one week at the one week visit if the problem persists a tissue biopsy should be performed.  Upon reexamination of this fourth toe. This appears to be more of a dermatitis as opposed to an infection. This patient already takes cortisone due to a kidney transplant   Gardiner Barefoot DPM

## 2015-10-11 NOTE — Telephone Encounter (Addendum)
Dr. Prudence Davidson states pt's CBC with Diff looks great thinks she has a dermatitis, call pt instruct to pick up OTC Cortaid and continue the antibiotic.  Informed pt of Dr. Burnell Blanks orders and pt states understanding.

## 2015-10-11 NOTE — Progress Notes (Signed)
Pt states she saw Dr. Prudence Davidson yesterday and was given topical and oral antibiotics for her 4th toe, but it is looking worse. Pt states it looks worse since using the Lamisil AF, I told pt to stop the cream.Transferred to schedulers to get pt into be seen tomorrow.

## 2015-10-16 ENCOUNTER — Ambulatory Visit: Payer: Medicare Other | Admitting: Podiatry

## 2016-05-01 ENCOUNTER — Ambulatory Visit: Payer: Medicare Other | Admitting: Cardiology

## 2019-03-01 ENCOUNTER — Other Ambulatory Visit: Payer: Self-pay

## 2019-03-01 DIAGNOSIS — Z20822 Contact with and (suspected) exposure to covid-19: Secondary | ICD-10-CM

## 2019-03-03 LAB — NOVEL CORONAVIRUS, NAA: SARS-CoV-2, NAA: NOT DETECTED

## 2019-04-06 ENCOUNTER — Ambulatory Visit: Payer: Medicare Other | Admitting: Cardiovascular Disease

## 2019-05-21 NOTE — Progress Notes (Signed)
Chief Complaint  Patient presents with  . New Patient (Initial Visit)    cardiomyopathy   History of Present Illness: 59 yo female with history of anemia, anxiety, non-ischemic cardiomyopathy, HTN, HLD, chronic kidney disease s/p renal transplant in 2013 who is here today as a new patient. She has a history of non-ischemic cardiomyopathy in 2011 with LVEF=20% at that time. Echo in 2012 with normal LV function. She has not had an echo since 2012. She tells me that she has been feeling well. No chest pain. Occasional dyspnea with exertion. Her daughter is a Marine scientist in our [employer]U. Bo Merino). She wanted to establish with a cardiologist today.   Primary Care Physician: Mayra Neer, MD  Past Medical History:  Diagnosis Date  . Allergic rhinitis   . Anemia due to chronic kidney disease   . Anxiety   . Cardiomyopathy ([place])   . Congestive dilated cardiomyopathy ([place]) 04/14/2013   History of ejection fraction 20%, now normal at 55% in 2012 [facility] echocardiogram  . Cough variant asthma   . Depression   . 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 Hypercholesteremia   . Hypertension   . Immunosuppression ([place])   . Kidney transplant status   . PMS (premenstrual syndrome)   . Renal disorder   . Rosacea   . Sinusitis     Past Surgical History:  Procedure Laterality Date  . APPENDECTOMY    . KIDNEY TRANSPLANT    . UTERINE FIBROIDECTOMY  07/1998    Current Outpatient Medications  Medication Sig Dispense Refill  . acetaminophen (TYLENOL) 500 MG tablet Take 500 mg by mouth every 6 (six) hours as needed. For pain    . aspirin 81 MG chewable tablet Chew 81 mg by mouth daily.    . carvedilol (COREG) 25 MG tablet Take 37.5 mg by mouth 2 (two) times daily with a meal.    . fluticasone (FLOVENT HFA) 110 MCG/ACT inhaler Inhale 1 puff into the lungs daily as needed for shortness of breath.    . losartan (COZAAR) 50 MG tablet Take 100 mg by mouth  daily.     . Multiple Vitamins-Minerals (CENTRUM ADULTS PO) Take 1 tablet by mouth daily.    . mycophenolate (CELLCEPT) 500 MG tablet Take 500 mg by mouth 2 (two) times daily.    Marland Kitchen omeprazole (PRILOSEC) 20 MG capsule Take 20 mg by mouth daily.    . predniSONE (DELTASONE) 5 MG tablet Take 5 mg by mouth daily.     . rosuvastatin (CRESTOR) 5 MG tablet Take 5 mg by mouth daily.    . Sirolimus 0.5 MG TABS Take 1 tablet by mouth daily.    Marland Kitchen venlafaxine XR (EFFEXOR XR) 150 MG 24 hr capsule Take 150 mg by mouth daily with breakfast.     No current facility-administered medications for this visit.    Allergies  Allergen Reactions  . Hydrocodone-Acetaminophen Itching  . Lisinopril Cough    Social History   Socioeconomic History  . Marital status: Married    Spouse name: Not on file  . Number of children: Not on file  . Years of education: Not on file  . Highest education level: Not on file  Occupational History  . Not on file  Tobacco Use  . Smoking status: Former Smoker    Quit date: 05/18/1990    Years since quitting: 29.0  . Smokeless tobacco: Never Used  Substance and Sexual Activity  . Alcohol use: No  .  Drug use: No  . Sexual activity: Not on file  Other Topics Concern  . Not on file  Social History Narrative  . Not on file   Social Determinants of Health   Financial Resource Strain:   . Difficulty of Paying Living Expenses: Not on file  Food Insecurity:   . Worried About Charity fundraiser in the Last Year: Not on file  . Ran Out of Food in the Last Year: Not on file  Transportation Needs:   . Lack of Transportation (Medical): Not on file  . Lack of Transportation (Non-Medical): Not on file  Physical Activity:   . Days of Exercise per Week: Not on file  . Minutes of Exercise per Session: Not on file  Stress:   . Feeling of Stress : Not on file  Social Connections:   . Frequency of Communication with Friends and Family: Not on file  . Frequency of Social  Gatherings with Friends and Family: Not on file  . Attends Religious Services: Not on file  . Active Member of Clubs or Organizations: Not on file  . Attends Archivist Meetings: Not on file  . Marital Status: Not on file  Intimate Partner Violence:   . Fear of Current or Ex-Partner: Not on file  . Emotionally Abused: Not on file  . Physically Abused: Not on file  . Sexually Abused: Not on file    Family History  Problem Relation Age of Onset  . Hypertension Mother     Review of Systems:  As stated in the HPI and otherwise negative.   BP 110/72   Pulse 70   Ht 5' 3.5" (1.613 m)   Wt 175 lb (79.4 kg)   SpO2 96%   BMI 30.51 kg/m   Physical Examination: General: Well developed, well nourished, NAD  HEENT: OP clear, mucus membranes moist  SKIN: warm, dry. No rashes. Neuro: No focal deficits  Musculoskeletal: Muscle strength 5/5 all ext  Psychiatric: Mood and affect normal  Neck: No JVD, no carotid bruits, no thyromegaly, no lymphadenopathy.  Lungs:Clear bilaterally, no wheezes, rhonci, crackles Cardiovascular: Regular rate and rhythm. No murmurs, gallops or rubs. Abdomen:Soft. Bowel sounds present. Non-tender.  Extremities: No lower extremity edema. Pulses are 2 + in the bilateral DP/PT.  EKG:  EKG is ordered today. The ekg ordered today demonstrates NSR  Recent Labs: No results found for requested labs within last 8760 hours.   Lipid Panel No results found for: CHOL, TRIG, HDL, CHOLHDL, VLDL, LDLCALC, LDLDIRECT   Wt Readings from Last 3 Encounters:  05/22/19 175 lb (79.4 kg)  03/11/15 164 lb (74.4 kg)  04/14/13 152 lb (68.9 kg)     Other studies Reviewed: Additional studies/ records that were reviewed today include:  Review of the above records demonstrates:    Assessment and Plan:   1. Non-ischemic cardiomyopathy: She had reduced LV systolic function by echo in 2011 but this normalized by echo in 2012. This was presumed to be a non-ischemic  cardiomyopathy. Will repeat echo now. Will continue Coreg and Losartan.   Current medicines are reviewed at length with the patient today.  The patient does not have concerns regarding medicines.  The following changes have been made:  no change  Labs/ tests ordered today include:   Orders Placed This Encounter  Procedures  . ECHOCARDIOGRAM COMPLETE     Disposition:   FU with me in one year.    Signed, Lauree Chandler, MD 05/22/2019 10:28 AM  [facility] [address], [place], [place]  [address] Phone: [phone]; Fax: [phone]

## 2019-05-22 ENCOUNTER — Other Ambulatory Visit: Payer: Self-pay

## 2019-05-22 ENCOUNTER — Encounter: Payer: Self-pay | Admitting: Cardiovascular Disease

## 2019-05-22 ENCOUNTER — Ambulatory Visit: Payer: Self-pay | Admitting: Cardiovascular Disease

## 2019-05-22 VITALS — BP 110/72 | HR 70 | Ht 63.5 in | Wt 175.0 lb

## 2019-05-22 DIAGNOSIS — I428 Other cardiomyopathies: Secondary | ICD-10-CM | POA: Diagnosis not present

## 2019-05-22 NOTE — Patient Instructions (Signed)
Medication Instructions:  Your physician recommends that you continue on your current medications as directed. Please refer to the Current Medication list given to you today.  *If you need a refill on your cardiac medications before your next appointment, please call your pharmacy*  Lab Work: None Ordered If you have labs (blood work) drawn today and your tests are completely normal, you will receive your results only by: Marland Kitchen MyChart Message (if you have MyChart) OR . A paper copy in the mail If you have any lab test that is abnormal or we need to change your treatment, we will call you to review the results.   Testing/Procedures: Your physician has requested that you have an echocardiogram. Echocardiography is a painless test that uses sound waves to create images of your heart. It provides your doctor with information about the size and shape of your heart and how well your heart's chambers and valves are working. This procedure takes approximately one hour. There are no restrictions for this procedure.     Follow-Up: At [facility], you and your health needs are our priority.  As part of our continuing mission to provide you with exceptional heart care, we have created designated Provider Care Teams.  These Care Teams include your primary Cardiologist (physician) and Advanced Practice Providers (APPs -  Physician Assistants and Nurse Practitioners) who all work together to provide you with the care you need, when you need it.  Your next appointment:   1 year(s)  The format for your next appointment:   In Person  Provider:   You may see Lauree Chandler, MD or one of the following Advanced Practice Providers on your designated Care Team:    Melina Copa, PA-C  Ermalinda Barrios, PA-C

## 2019-05-23 NOTE — Addendum Note (Signed)
Addended by: De Burrs on: 05/23/2019 08:37 AM   Modules accepted: Orders

## 2019-06-01 ENCOUNTER — Other Ambulatory Visit: Payer: Self-pay

## 2019-06-01 ENCOUNTER — Ambulatory Visit (HOSPITAL_COMMUNITY): Payer: 59 | Attending: Cardiology

## 2019-06-01 DIAGNOSIS — I428 Other cardiomyopathies: Secondary | ICD-10-CM | POA: Insufficient documentation

## 2019-06-02 ENCOUNTER — Telehealth: Payer: Self-pay | Admitting: Cardiovascular Disease

## 2019-06-02 NOTE — Telephone Encounter (Signed)
Patient calling for echo results 

## 2019-06-02 NOTE — Telephone Encounter (Signed)
The patient has been notified of the ECHO result and verbalized understanding.  All questions (if any) were answered. Wilma Flavin, RN 06/02/2019 10:09 AM

## 2020-11-01 ENCOUNTER — Other Ambulatory Visit (HOSPITAL_COMMUNITY): Payer: Self-pay

## 2021-07-11 ENCOUNTER — Other Ambulatory Visit: Payer: Self-pay | Admitting: Family Medicine

## 2021-07-11 ENCOUNTER — Ambulatory Visit
Admission: RE | Admit: 2021-07-11 | Discharge: 2021-07-11 | Disposition: A | Discharge status: Home / Self Care | Payer: 59 | Source: Ambulatory Visit | Attending: Family Medicine | Admitting: Family Medicine

## 2021-07-11 DIAGNOSIS — R059 Cough, unspecified: Secondary | ICD-10-CM

## 2021-10-27 ENCOUNTER — Ambulatory Visit (HOSPITAL_COMMUNITY)
Admission: RE | Admit: 2021-10-27 | Discharge: 2021-10-27 | Disposition: A | Discharge status: Home / Self Care | Payer: 59 | Attending: Gastroenterology | Admitting: Gastroenterology

## 2021-10-27 ENCOUNTER — Encounter (HOSPITAL_COMMUNITY)
Admission: RE | Disposition: A | Discharge status: Home / Self Care | Payer: Self-pay | Source: Home / Self Care | Attending: Gastroenterology

## 2021-10-27 DIAGNOSIS — Z940 Kidney transplant status: Secondary | ICD-10-CM | POA: Diagnosis not present

## 2021-10-27 DIAGNOSIS — Z9489 Other transplanted organ and tissue status: Secondary | ICD-10-CM | POA: Diagnosis present

## 2021-10-27 DIAGNOSIS — I10 Essential (primary) hypertension: Secondary | ICD-10-CM | POA: Insufficient documentation

## 2021-10-27 DIAGNOSIS — E213 Hyperparathyroidism, unspecified: Secondary | ICD-10-CM | POA: Insufficient documentation

## 2021-10-27 DIAGNOSIS — I420 Dilated cardiomyopathy: Secondary | ICD-10-CM | POA: Diagnosis not present

## 2021-10-27 DIAGNOSIS — F32A Depression, unspecified: Secondary | ICD-10-CM | POA: Insufficient documentation

## 2021-10-27 HISTORY — PX: GIVENS CAPSULE STUDY: SHX5432

## 2021-10-27 SURGERY — IMAGING PROCEDURE, GI TRACT, INTRALUMINAL, VIA CAPSULE

## 2021-10-27 SURGICAL SUPPLY — 1 items: TOWEL COTTON PACK 4EA (MISCELLANEOUS) ×6 IMPLANT

## 2021-10-28 ENCOUNTER — Encounter (HOSPITAL_COMMUNITY): Payer: Self-pay | Admitting: Gastroenterology

## 2022-01-01 ENCOUNTER — Other Ambulatory Visit: Payer: Self-pay | Admitting: Gastroenterology

## 2022-01-05 ENCOUNTER — Encounter (HOSPITAL_COMMUNITY): Payer: Self-pay | Admitting: Gastroenterology

## 2022-01-06 ENCOUNTER — Encounter (HOSPITAL_COMMUNITY): Payer: Self-pay | Admitting: Gastroenterology

## 2022-01-09 ENCOUNTER — Ambulatory Visit (HOSPITAL_COMMUNITY)
Admission: RE | Admit: 2022-01-09 | Discharge: 2022-01-09 | Disposition: A | Discharge status: Home / Self Care | Payer: 59 | Attending: Gastroenterology | Admitting: Gastroenterology

## 2022-01-09 ENCOUNTER — Encounter (HOSPITAL_COMMUNITY)
Admission: RE | Disposition: A | Discharge status: Home / Self Care | Payer: Self-pay | Source: Home / Self Care | Attending: Gastroenterology

## 2022-01-09 ENCOUNTER — Encounter (HOSPITAL_COMMUNITY): Payer: Self-pay | Admitting: Gastroenterology

## 2022-01-09 ENCOUNTER — Ambulatory Visit (HOSPITAL_COMMUNITY): Payer: 59 | Admitting: Certified Registered Nurse Anesthetist

## 2022-01-09 ENCOUNTER — Ambulatory Visit (HOSPITAL_BASED_OUTPATIENT_CLINIC_OR_DEPARTMENT_OTHER): Payer: 59 | Admitting: Certified Registered Nurse Anesthetist

## 2022-01-09 ENCOUNTER — Other Ambulatory Visit: Payer: Self-pay

## 2022-01-09 DIAGNOSIS — Z7952 Long term (current) use of systemic steroids: Secondary | ICD-10-CM | POA: Diagnosis not present

## 2022-01-09 DIAGNOSIS — I10 Essential (primary) hypertension: Secondary | ICD-10-CM | POA: Insufficient documentation

## 2022-01-09 DIAGNOSIS — K31819 Angiodysplasia of stomach and duodenum without bleeding: Secondary | ICD-10-CM | POA: Insufficient documentation

## 2022-01-09 DIAGNOSIS — F418 Other specified anxiety disorders: Secondary | ICD-10-CM | POA: Diagnosis not present

## 2022-01-09 DIAGNOSIS — Z87891 Personal history of nicotine dependence: Secondary | ICD-10-CM | POA: Insufficient documentation

## 2022-01-09 DIAGNOSIS — Z940 Kidney transplant status: Secondary | ICD-10-CM | POA: Insufficient documentation

## 2022-01-09 DIAGNOSIS — D649 Anemia, unspecified: Secondary | ICD-10-CM | POA: Diagnosis not present

## 2022-01-09 DIAGNOSIS — Z79899 Other long term (current) drug therapy: Secondary | ICD-10-CM | POA: Diagnosis not present

## 2022-01-09 DIAGNOSIS — K219 Gastro-esophageal reflux disease without esophagitis: Secondary | ICD-10-CM | POA: Diagnosis not present

## 2022-01-09 HISTORY — PX: ENTEROSCOPY: SHX5533

## 2022-01-09 HISTORY — PX: HOT HEMOSTASIS: SHX5433

## 2022-01-09 SURGERY — ENTEROSCOPY
Anesthesia: Monitor Anesthesia Care

## 2022-01-09 MED ORDER — PROPOFOL 500 MG/50ML IV EMUL
INTRAVENOUS | Status: DC | PRN
  Administered 2022-01-09: 125 ug/kg/min via INTRAVENOUS

## 2022-01-09 MED ORDER — PROPOFOL 10 MG/ML IV BOLUS
INTRAVENOUS | Status: DC | PRN
  Administered 2022-01-09: 20 mg via INTRAVENOUS
  Administered 2022-01-09: 30 mg via INTRAVENOUS

## 2022-01-09 MED ORDER — SODIUM CHLORIDE 0.9 % IV SOLN: INTRAVENOUS | Status: DC

## 2022-01-09 MED ORDER — PROPOFOL 1000 MG/100ML IV EMUL
INTRAVENOUS | Status: AC
  Filled 2022-01-09: qty 100

## 2022-01-09 MED ORDER — LIDOCAINE 2% (20 MG/ML) 5 ML SYRINGE
INTRAMUSCULAR | Status: DC | PRN
  Administered 2022-01-09: 80 mg via INTRAVENOUS

## 2022-01-09 MED ORDER — PROPOFOL 10 MG/ML IV BOLUS
INTRAVENOUS | Status: AC
  Filled 2022-01-09: qty 20

## 2022-01-09 MED ORDER — LACTATED RINGERS IV SOLN: INTRAVENOUS | Status: DC | PRN

## 2022-01-09 NOTE — H&P (Signed)
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  She is here today for ablation.  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[facility], glomerulonephritis   Hypercholesteremia    Hypertension    Immunosuppression ([place])    Kidney transplant status    PMS (premenstrual syndrome)    Renal disorder    Rosacea    Sinusitis     Past Surgical History:  Procedure Laterality Date   APPENDECTOMY     GIVENS CAPSULE STUDY N/A 10/27/2021   Procedure: GIVENS CAPSULE STUDY;  Surgeon: Carol Ada, MD;  Location: [place];  Service: Gastroenterology;  Laterality: N/A;   KIDNEY TRANSPLANT     UTERINE FIBROIDECTOMY  07/1998    Family History  Problem Relation Age of Onset   Hypertension Mother     Social History:  reports that she quit smoking about 31 years ago. She has never used smokeless tobacco. She reports that she does not drink alcohol and does not use drugs.  Allergies:  Allergies  Allergen Reactions   Hydrocodone-Acetaminophen Itching   Lisinopril Cough    Medications: Scheduled: Continuous:  No results found for this or any previous visit (from the past 24 hour(s)).   No results found.  ROS:  As stated above in the HPI otherwise negative.  Height '5\' 4"'$  (1.626 m), weight 74.8 kg.    PE: Gen: NAD, Alert and Oriented HEENT:  [place]/AT, EOMI Neck: Supple, no LAD Lungs: CTA Bilaterally CV: RRR without M/G/R ABD: Soft, NTND, +BS Ext: No C/C/E  Assessment/Plan: 1) Duodenal AVMs. 2) IDA.  Plan: 1) Enteroscopy with APC.  Erin Ward 01/09/2022, 9:49 AM

## 2022-01-09 NOTE — Discharge Instructions (Signed)

## 2022-01-09 NOTE — Anesthesia Preprocedure Evaluation (Signed)
Anesthesia Evaluation  Patient identified by MRN, date of birth, ID band Patient awake    Reviewed: Allergy & Precautions, NPO status , Patient's Chart, lab work & pertinent test results  History of Anesthesia Complications Negative for: history of anesthetic complications  Airway Mallampati: III  TM Distance: >3 FB Neck ROM: Full    Dental  (+) Teeth Intact, Dental Advisory Given   Pulmonary neg shortness of breath, asthma , neg sleep apnea, neg COPD, neg recent URI, former smoker,    breath sounds clear to auscultation       Cardiovascular hypertension, Pt. on medications  Rhythm:Regular  1. Normal LV systolic function; grade 1 diastolic dysfunction; mild LAE.  2. Left ventricular ejection fraction, by estimation, is 65 to 70%. The  left ventricle has normal function. The left ventrical has no regional  wall motion abnormalities. Left ventricular diastolic parameters are  consistent with Grade I diastolic  dysfunction (impaired relaxation).  3. Right ventricular systolic function is normal. The right ventricular  size is normal. There is normal pulmonary artery systolic pressure.  4. Left atrial size was mildly dilated.  5. The mitral valve is normal in structure and function. trivial mitral  valve regurgitation. No evidence of mitral stenosis.  6. The aortic valve is tricuspid. Aortic valve regurgitation is not  visualized. Mild aortic valve sclerosis is present, with no evidence of  aortic valve stenosis.  7. The inferior vena cava is normal in size with greater than 50%  respiratory variability, suggesting right atrial pressure of 3 mmHg.    Neuro/Psych PSYCHIATRIC DISORDERS Anxiety Depression negative neurological ROS     GI/Hepatic Neg liver ROS, GERD  Medicated,? GI bleed   Endo/Other  Chronic steroids  Renal/GU Renal diseaseS/p transplant  Lab Results      Component                Value                Date                      CREATININE               0.90                05/30/2013                Musculoskeletal negative musculoskeletal ROS (+)   Abdominal   Peds  Hematology Lab Results      Component                Value               Date                      WBC                      7.6                 10/11/2015                HGB                      13.2                10/11/2015                HCT  38.7                10/11/2015                MCV                      82                  10/11/2015                PLT                      288                 10/11/2015              Anesthesia Other Findings   Reproductive/Obstetrics                             Anesthesia Physical Anesthesia Plan  ASA: 2  Anesthesia Plan: MAC   Post-op Pain Management: Minimal or no pain anticipated   Induction: Intravenous  PONV Risk Score and Plan: 2 and Treatment may vary due to age or medical condition  Airway Management Planned: Nasal Cannula and Natural Airway  Additional Equipment: None  Intra-op Plan:   Post-operative Plan:   Informed Consent: I have reviewed the patients History and Physical, chart, labs and discussed the procedure including the risks, benefits and alternatives for the proposed anesthesia with the patient or authorized representative who has indicated his/her understanding and acceptance.     Dental advisory given  Plan Discussed with: CRNA  Anesthesia Plan Comments:         Anesthesia Quick Evaluation

## 2022-01-09 NOTE — Op Note (Signed)
[facility] Patient Name: Erin Ward Procedure Date: 01/09/2022 MRN: [id number] Attending MD: Carol Ada , MD Date of Birth: 1960-05-31 CSN: [id number] Age: 61 Admit Type: Outpatient Procedure:                Small bowel enteroscopy Indications:              Arteriovenous malformation in the small intestine Providers:                Carol Ada, MD, Benay Pillow, RN, Cletis Athens,                            Technician Referring MD:              Medicines:                Propofol per Anesthesia Complications:            No immediate complications. Estimated Blood Loss:     Estimated blood loss: none. Procedure:                Pre-Anesthesia Assessment:                           - Prior to the procedure, a History and Physical                            was performed, and patient medications and                            allergies were reviewed. The patient's tolerance of                            previous anesthesia was also reviewed. The risks                            and benefits of the procedure and the sedation                            options and risks were discussed with the patient.                            All questions were answered, and informed consent                            was obtained. Prior Anticoagulants: The patient has                            taken no previous anticoagulant or antiplatelet                            agents. ASA Grade Assessment: III - A patient with                            severe systemic disease. After reviewing the risks  and benefits, the patient was deemed in                            satisfactory condition to undergo the procedure.                           - Sedation was administered by an anesthesia                            professional. Deep sedation was attained.                           After obtaining informed consent, the endoscope was                            passed  under direct vision. Throughout the                            procedure, the patient's blood pressure, pulse, and                            oxygen saturations were monitored continuously. The                            [id number]-[id number] ([id number]) Olympus colonoscope was                            introduced through the mouth and advanced to the                            small bowel distal to the Ligament of Treitz. The                            small bowel enteroscopy was accomplished without                            difficulty. The patient tolerated the procedure                            well. Scope In: Scope Out: Findings:      The esophagus was normal.      The stomach was normal.      The examined duodenum was normal.      A single angiodysplastic lesion with no bleeding was found in the       proximal jejunum. Coagulation for tissue destruction using monopolar       probe was successful. Estimated blood loss: none.      Two deep passes into the jejunum were performed and the AVM noted on the       VCE was identified. In retrospect, it appears that it was only one AVM       located in the proximal jejunum. It was successfully ablated with APC. Impression:               - Normal esophagus.                           -  Normal stomach.                           - Normal examined duodenum.                           - A single non-bleeding angiodysplastic lesion in                            the jejunum. Treated with a monopolar probe.                           - No specimens collected. Recommendation:           - Patient has a contact number available for                            emergencies. The signs and symptoms of potential                            delayed complications were discussed with the                            patient. Return to normal activities tomorrow.                            Written discharge instructions were provided to the                             patient.                           - Resume regular diet.                           - Return to GI clinic in 3 months.                           - Iron supplement QD. Procedure Code(s):        --- Professional ---                           [phone], Small intestinal endoscopy, enteroscopy                            beyond second portion of duodenum, not including                            ileum; with ablation of tumor(s), polyp(s), or                            other lesion(s) not amenable to removal by hot                            biopsy forceps, bipolar cautery or snare technique Diagnosis Code(s):        --- Professional ---  K55.20, Angiodysplasia of colon without hemorrhage                           K31.819, Angiodysplasia of stomach and duodenum                            without bleeding CPT copyright 2019 American Medical Association. All rights reserved. The codes documented in this report are preliminary and upon coder review may  be revised to meet current compliance requirements. Carol Ada, MD Carol Ada, MD 01/09/2022 11:04:49 AM This report has been signed electronically. Number of Addenda: 0

## 2022-01-09 NOTE — Transfer of Care (Signed)
Immediate Anesthesia Transfer of Care Note  Patient: Erin Ward  Procedure(s) Performed: ENTEROSCOPY HOT HEMOSTASIS (ARGON PLASMA COAGULATION/BICAP)  Patient Location: Endoscopy Unit  Anesthesia Type:MAC  Level of Consciousness: drowsy and patient cooperative  Airway & Oxygen Therapy: Patient Spontanous Breathing and Patient connected to face mask oxygen  Post-op Assessment: Report given to RN and Post -op Vital signs reviewed and stable  Post vital signs: Reviewed and stable  Last Vitals:  Vitals Value Taken Time  BP 105/52 01/09/22 1103  Temp    Pulse 63 01/09/22 1106  Resp 13 01/09/22 1106  SpO2 100 % 01/09/22 1106  Vitals shown include unvalidated device data.  Last Pain:  Vitals:   01/09/22 0949  TempSrc: Temporal  PainSc: 0-No pain         Complications: No notable events documented.

## 2022-01-11 ENCOUNTER — Encounter (HOSPITAL_COMMUNITY): Payer: Self-pay | Admitting: Gastroenterology

## 2022-01-12 NOTE — Anesthesia Postprocedure Evaluation (Signed)
Anesthesia Post Note  Patient: AYDA TANCREDI  Procedure(s) Performed: ENTEROSCOPY HOT HEMOSTASIS (ARGON PLASMA COAGULATION/BICAP)     Patient location during evaluation: Endoscopy Anesthesia Type: MAC Level of consciousness: awake and alert Pain management: pain level controlled Vital Signs Assessment: post-procedure vital signs reviewed and stable Respiratory status: spontaneous breathing, nonlabored ventilation and respiratory function stable Cardiovascular status: stable and blood pressure returned to baseline Postop Assessment: no apparent nausea or vomiting Anesthetic complications: no   No notable events documented.  Last Vitals:  Vitals:   01/09/22 1125 01/09/22 1130  BP: 125/65   Pulse: 65 65  Resp: 14 14  Temp:    SpO2: 98% 95%    Last Pain:  Vitals:   01/09/22 1120  TempSrc:   PainSc: 0-No pain                 Veronda Gabor

## 2022-03-19 ENCOUNTER — Ambulatory Visit (INDEPENDENT_AMBULATORY_CARE_PROVIDER_SITE_OTHER): Payer: 59 | Admitting: Podiatry

## 2022-03-19 ENCOUNTER — Ambulatory Visit (INDEPENDENT_AMBULATORY_CARE_PROVIDER_SITE_OTHER): Payer: 59

## 2022-03-19 DIAGNOSIS — M21612 Bunion of left foot: Secondary | ICD-10-CM

## 2022-03-19 DIAGNOSIS — M21962 Unspecified acquired deformity of left lower leg: Secondary | ICD-10-CM

## 2022-03-19 DIAGNOSIS — M2012 Hallux valgus (acquired), left foot: Secondary | ICD-10-CM | POA: Diagnosis not present

## 2022-03-19 DIAGNOSIS — M21619 Bunion of unspecified foot: Secondary | ICD-10-CM | POA: Diagnosis not present

## 2022-03-19 NOTE — Progress Notes (Signed)
  Subjective:  Patient ID: Erin Ward, female    DOB: 02/09/1961,  MRN: [id number]  Chief Complaint  Patient presents with   Bunions    (np) left foot bunion pain - interested in surgical correction before it gets worse    61 y.o. female presents with the above complaint. History confirmed with patient.  She has had this for a long time is beginning to become more comfortable with making sure her heart is aware and it is starting to bother her on a more daily basis.  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  Objective:  Physical Exam: warm, good capillary refill, no trophic changes or ulcerative lesions, normal DP and PT pulses, and normal sensory exam. Left Foot:  Moderate severe hallux valgus deformity with hypermobility of the first ray and abduction and valgus deformity of the hallux   Radiographs: Multiple views x-ray of the left foot: Moderate hallux valgus deformity with large increase in intermetatarsal angle frontal plane rotation and deviation of the sesamoids.  Elongated second metatarsal Assessment:   1. Bunion   2. Hallux valgus with bunions, left   3. Metatarsal deformity, left      Plan:  Patient was evaluated and treated and all questions answered.  Discussed the etiology and treatment including surgical and non surgical treatment for painful bunions.  She has exhausted all non surgical treatment prior to this visit including shoe gear changes and padding.  She desires surgical intervention. We discussed all risks including but not limited to: pain, swelling, infection, scar, numbness which may be temporary or permanent, chronic pain, stiffness, nerve pain or damage, wound healing problems, bone healing problems including delayed or non-union and recurrence. Specifically we discussed the following procedures: Lapidus  bunionectomy, possible Akin and/or Reverdin and, bone graft from the heel and second metatarsal shortening osteotomy.  We discussed the rationale of these procedures in detail.  Informed consent was signed today. Surgery will be scheduled at a mutually agreeable date. Information regarding this will be forwarded to our surgery scheduler.  Vitamin D and calcium level orders were placed.  We also discussed the overall risk of wound and bone healing as this relates to her immunosuppression for her transplant.  I will reach out to her nephrologist and transplant team to see if potentially the prednisone could be decreased or stopped temporarily but will defer to them.  May be a matter of evaluating with risk we will plan to place her on antibiotics after surgery and likely restricted weightbearing for 3 to 4 weeks   Surgical plan:  Procedure: - Lapidus bunionectomy, possible Akin and/or Reverdin and, bone graft from the heel and second metatarsal shortening osteotomy.  Location: -[place]  Anesthesia plan: -IV sedation with regional block and/or per cardiac clearance  Postoperative pain plan: - Tylenol 1000 mg every 6 hours,  gabapentin 300 mg every 8 hours x5 days, oxycodone 5 mg 1-2 tabs every 6 hours only as needed  DVT prophylaxis: -Xarelto 10 mg nightly x30 days  WB Restrictions / DME needs: -NWB in splint postop, expect this will be for at least 4 weeks    No follow-ups on file.

## 2022-03-25 ENCOUNTER — Encounter: Payer: Self-pay | Admitting: Podiatry

## 2022-03-25 ENCOUNTER — Telehealth: Payer: Self-pay | Admitting: Cardiovascular Disease

## 2022-03-25 NOTE — Telephone Encounter (Signed)
   Pre-operative Risk Assessment    Patient Name: Erin Ward  DOB: 1960/12/22 MRN: [id number]      Request for Surgical Clearance    Procedure:   Bunionectomy Left foot and aiken osteotomy, metatarsal osteotomy  Date of Surgery:  Clearance 06/26/22                                 Surgeon:  Dr. Lanae Crumbly Surgeon's Group or Practice Name:  [facility] Phone number:  [phone] Fax number:  [phone]   Type of Clearance Requested:   - Medical  - Pharmacy:  Hold    let them know if she needs to hold anything   Type of Anesthesia:   Anesthesiologist Choice   Additional requests/questions:    [name]   03/25/2022, 2:29 PM

## 2022-03-25 NOTE — Telephone Encounter (Signed)
   Name: Erin Ward  DOB: 01-27-1961  MRN: [id number]  Primary Cardiologist: Mertie Moores, MD  Chart reviewed as part of pre-operative protocol coverage. Because of Erin Ward's past medical history and time since last visit, she will require a follow-up in-office visit in order to better assess preoperative cardiovascular risk.  Pre-op covering staff: - Please schedule appointment and call patient to inform them. If patient already had an upcoming appointment within acceptable timeframe, please add "pre-op clearance" to the appointment notes so provider is aware. - Please contact requesting surgeon's office via preferred method (i.e, phone, fax) to inform them of need for appointment prior to surgery.  Lenna Sciara, NP  03/25/2022, 3:12 PM

## 2022-03-26 NOTE — Telephone Encounter (Signed)
S/w pt appt made for Dec 18 with Christen Bame, NP for pre-op clearance.

## 2022-04-01 ENCOUNTER — Telehealth: Payer: Self-pay

## 2022-04-01 NOTE — Telephone Encounter (Signed)
Erin Ward called to cancel her surgey with Dr. Sherryle Lis on 06/26/2022. She stated she doesn't want to have surgery right now and her kidney doctor stated it would be better to hold off. Notified Dr. Sherryle Lis and Caren Griffins at [place].

## 2022-04-02 NOTE — Progress Notes (Signed)
Cardiology Office Note:    Date:  04/06/2022   ID:  Erin Ward, DOB 14-Jan-1961, MRN [id number]  PCP:  Mayra Neer, MD   [facility] Providers Cardiologist:  Mertie Moores, MD     Referring MD: Mayra Neer, MD   Chief Complaint: preoperative cardiac evalation  History of Present Illness:    Erin Ward is a very pleasant 61 y.o. female with a hx of nonischemic cardiomyopathy, HTN, HLD, chronic kidney disease s/p renal transplant 2013, anemia, and anxiety.  Referred to cardiology and seen by Dr. Angelena Form 05/22/2019.  History of NICM in 2011 with LVEF 20% at that time.  Echo 2012 with normal LV function.  At that visit, she did not have a more recent echocardiogram. Her daughter is a Marine scientist in [employer]). She reported occasional DOE.   2D echo 06/01/2019 revealed normal LVEF 65 to 08%, grade 1 diastolic dysfunction, mild LAE, normal RV, no significant valve disease. She was advised to return in 1 year for follow-up.   Today, she originally scheduled the appointment for preoperative cardiac evaluation however she has decided to cancel the surgery due to recommendation from her nephrologist not to hold post transplant medications for elective surgery.  She has overall been feeling well.  Occasional lightheadedness, particularly after taking morning dose of carvedilol.  She has decreased carvedilol to  25 mg in the mornings and 37.5 mg in the evenings. Reports a drop in hemoglobin earlier in 2023, had GI workup and was found to have AVMs  which were cauterized. Has been on iron infusions and hgb has improved. She denies chest pain, shortness of breath, lower extremity edema, fatigue, palpitations, diaphoresis, weakness, presyncope, syncope, orthopnea, and PND.  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)    Kidney transplant status    PMS (premenstrual syndrome)    Renal disorder    Rosacea    Sinusitis     Past Surgical History:  Procedure Laterality Date   APPENDECTOMY     ENTEROSCOPY N/A 01/09/2022   Procedure: ENTEROSCOPY;  Surgeon: Carol Ada, MD;  Location: [facility];  Service: Gastroenterology;  Laterality: N/A;   GIVENS CAPSULE STUDY N/A 10/27/2021   Procedure: GIVENS CAPSULE STUDY;  Surgeon: Carol Ada, MD;  Location: [facility];  Service: Gastroenterology;  Laterality: N/A;   HOT HEMOSTASIS N/A 01/09/2022   Procedure: HOT HEMOSTASIS (ARGON PLASMA COAGULATION/BICAP);  Surgeon: Carol Ada, MD;  Location: [facility];  Service: Gastroenterology;  Laterality: N/A;   KIDNEY TRANSPLANT     UTERINE FIBROIDECTOMY  07/1998    Current Medications: Current Meds  Medication Sig   acetaminophen (TYLENOL) 500 MG tablet Take 1,000 mg by mouth every 6 (six) hours as needed for moderate pain.   albuterol (VENTOLIN HFA) 108 (90 Base) MCG/ACT inhaler Inhale 2 puffs into the lungs every 6 (six) hours as needed for wheezing or shortness of breath.   aspirin 81 MG chewable tablet Chew 81 mg by mouth daily.   carvedilol (COREG) 25 MG tablet Take 25 mg by mouth  as directed. Take one (1) tablet by mouth (25 mg) in the am and one and one half (1.5) tablet by mouth ( 37.5 mg ) in the evening.   cetirizine (ZYRTEC) 10 MG tablet Take 10 mg by mouth daily.   diphenhydrAMINE (BENADRYL) 25 MG tablet Take 25 mg by mouth at bedtime as needed for sleep.   losartan (COZAAR) 100 MG tablet Take 100 mg by mouth daily.   Multiple Vitamins-Minerals (CENTRUM ADULTS PO) Take 1 tablet by mouth daily.   mycophenolate (CELLCEPT) 500 MG tablet Take 500 mg by  mouth 2 (two) times daily.   Olopatadine HCl (PATADAY OP) Place 1 drop into both eyes daily as needed (allergies).   Omega-3 Fatty Acids (FISH OIL) 1000 MG CAPS Take 1,000 mg by mouth daily.   omeprazole (PRILOSEC) 20 MG capsule Take 20 mg by mouth daily.   Polyethyl Glyc-Propyl Glyc PF (SYSTANE HYDRATION PF) 0.4-0.3 % SOLN Place 1 drop into both eyes daily as needed (dry eyes).   predniSONE (DELTASONE) 5 MG tablet Take 5 mg by mouth daily.    Probiotic Product (PROBIOTIC PO) Take 1 capsule by mouth daily.   rosuvastatin (CRESTOR) 5 MG tablet Take 5 mg by mouth daily.   Sirolimus 0.5 MG TABS Take 1 tablet by mouth daily.   triamcinolone (NASACORT ALLERGY 24HR) 55 MCG/ACT AERO nasal inhaler Place 2 sprays into the nose daily as needed (allergies).   venlafaxine XR (EFFEXOR XR) 150 MG 24 hr capsule Take 150 mg by mouth daily with breakfast.   [DISCONTINUED] carvedilol (COREG) 25 MG tablet Take 37.5 mg by mouth 2 (two) times daily with a meal.     Allergies:   Hydrocodone-acetaminophen and Lisinopril   Social History   Socioeconomic History   Marital status: Married    Spouse name: Not on file   Number of children: 1   Years of education: Not on file   Highest education level: Not on file  Occupational History   Occupation: Accounting  Tobacco Use   Smoking status: Former    Types: Cigarettes    Quit date: 05/18/1990    Years since quitting: 31.9   Smokeless tobacco: Never  Vaping Use   Vaping Use: Never used  Substance and Sexual Activity   Alcohol use: No   Drug use: No   Sexual activity: Not on file  Other Topics Concern   Not on file  Social History Narrative   Not on file   Social Determinants of Health   Financial Resource Strain: Not on file  Food Insecurity: Not on file  Transportation Needs: Not on file  Physical Activity: Not on file  Stress: Not on file  Social Connections: Not on file     Family History: The patient's family history includes Hypertension  in her mother.  ROS:   Please see the history of present illness.  All other systems reviewed and are negative.  Labs/Other Studies Reviewed:    The following studies were reviewed today:  Echo 06/01/19 1. Normal LV systolic function; grade 1 diastolic dysfunction; mild LAE.   2. Left ventricular ejection fraction, by estimation, is 65 to 70%. The  left ventricle has normal function. The left ventrical has no regional  wall motion abnormalities. Left ventricular diastolic parameters are  consistent with Grade I diastolic  dysfunction (impaired relaxation).   3. Right ventricular systolic function is normal. The right ventricular  size is normal. There is normal pulmonary artery systolic pressure.   4. Left atrial  size was mildly dilated.   5. The mitral valve is normal in structure and function. trivial mitral  valve regurgitation. No evidence of mitral stenosis.   6. The aortic valve is tricuspid. Aortic valve regurgitation is not  visualized. Mild aortic valve sclerosis is present, with no evidence of  aortic valve stenosis.   7. The inferior vena cava is normal in size with greater than 50%  respiratory variability, suggesting right atrial pressure of 3 mmHg.  Recent Labs: No results found for requested labs within last 365 days.  Recent Lipid Panel No results found for: "CHOL", "TRIG", "HDL", "CHOLHDL", "VLDL", "LDLCALC", "LDLDIRECT"   Risk Assessment/Calculations:       Physical Exam:    VS:  BP 112/70   Pulse 66   Ht '5\' 4"'$  (1.626 m)   Wt 170 lb 6.4 oz (77.3 kg)   SpO2 98%   BMI 29.25 kg/m     Wt Readings from Last 3 Encounters:  04/06/22 170 lb 6.4 oz (77.3 kg)  01/06/22 165 lb (74.8 kg)  10/27/21 165 lb (74.8 kg)     GEN:  Well nourished, well developed in no acute distress HEENT: Normal NECK: No JVD; No carotid bruits CARDIAC: RRR, no murmurs, rubs, gallops RESPIRATORY:  Clear to auscultation without rales, wheezing or rhonchi  ABDOMEN: Soft,  non-tender, non-distended MUSCULOSKELETAL:  No edema; No deformity. 2+ pedal pulses, equal bilaterally SKIN: Warm and dry NEUROLOGIC:  Alert and oriented x 3 PSYCHIATRIC:  Normal affect   EKG:  EKG is ordered today.  The ekg ordered today demonstrates normal sinus rhythm at 66 bpm, no ST abnormality    Diagnoses:    1. NICM (nonischemic cardiomyopathy) ([place])   2. Essential hypertension   3. Hyperlipidemia LDL goal <70   4. History of renal transplant    Assessment and Plan:     NICM: History of reduced LVEF 2011 which normalized in 2012. Normal LV function 65 to 70%, G1 DD, normal RV size and function on echo 05/2019 appears euvolemic on exam. No dyspnea, orthopnea, PND, or edema. Continue carvedilol and losartan.  Hypertension: BP is well-controlled today. Has episodes of lightheadedness for which she has decreased her morning dose of carvedilol to 25 mg.  She continues to take 37.5 mg in the evenings. Kidney function has been stable. No additional medication changes today.  Hyperlipidemia: LDL 96 on 01/16/22. Discussed the importance of keeping LDL well below 100, ideally < 70. Encouraged mostly plant-based diet, avoiding saturated fat, processed foods, and sugar. She would like to work on diet and exercise improvements before increasing rosuvastatin. Information on healthy diet and lifestyle guidelines provided. Continue rosuvastatin.   History of renal transplant: No recent concerns per patient. Kidney function is stable. Management per nephrology.   Disposition: 1 year with Dr. Angelena Form  Medication Adjustments/Labs and Tests Ordered: Current medicines are reviewed at length with the patient today.  Concerns regarding medicines are outlined above.  Orders Placed This Encounter  Procedures   EKG 12-Lead   No orders of the defined types were placed in this encounter.   Patient Instructions  Medication Instructions:   Your physician recommends that you continue on your current  medications as directed. Please refer to the Current Medication list given to you today.   *If you need a refill on your cardiac medications before your next appointment, please call your pharmacy*   Lab Work:  None ordered.  If you have labs (blood work) drawn today and your tests are  completely normal, you will receive your results only by: MyChart Message (if you have MyChart) OR A paper copy in the mail If you have any lab test that is abnormal or we need to change your treatment, we will call you to review the results.   Testing/Procedures:   None ordered.   Follow-Up: At [facility], you and your health needs are our priority.  As part of our continuing mission to provide you with exceptional heart care, we have created designated Provider Care Teams.  These Care Teams include your primary Cardiologist (physician) and Advanced Practice Providers (APPs -  Physician Assistants and Nurse Practitioners) who all work together to provide you with the care you need, when you need it.  We recommend signing up for the patient portal called "MyChart".  Sign up information is provided on this After Visit Summary.  MyChart is used to connect with patients for Virtual Visits (Telemedicine).  Patients are able to view lab/test results, encounter notes, upcoming appointments, etc.  Non-urgent messages can be sent to your provider as well.   To learn more about what you can do with MyChart, go to [email or web address].    Your next appointment:   1 year(s)  The format for your next appointment:   In Person  Provider:   Lauree Chandler, MD    Other Instructions  Your physician wants you to follow-up in: 1 year with Dr. Angelena Form.  You will receive a reminder letter in the mail two months in advance. If you don't receive a letter, please call our office to schedule the follow-up appointment.  Adopting a Healthy Lifestyle.   Weight: Know what a healthy weight is for you  (roughly BMI <25) and aim to maintain this. You can calculate your body mass index on your smart phone  Diet: Aim for 7+ servings of fruits and vegetables daily Limit animal fats in diet for cholesterol and heart health - choose grass fed whenever available Avoid highly processed foods (fast food burgers, tacos, fried chicken, pizza, hot dogs, french fries)  Saturated fat comes in the form of butter, lard, coconut oil, margarine, partially hydrogenated oils, and fat in meat. These increase your risk of cardiovascular disease.  Use healthy plant oils, such as olive, canola, soy, corn, sunflower and peanut.  Whole foods such as fruits, vegetables and whole grains have fiber  Men need > 38 grams of fiber per day Women need > 25 grams of fiber per day  Load up on vegetables and fruits - one-half of your plate: Aim for color and variety, and remember that potatoes dont count. Go for whole grains - one-quarter of your plate: Whole wheat, barley, wheat berries, quinoa, oats, brown rice, and foods made with them. If you want pasta, go with whole wheat pasta. Protein power - one-quarter of your plate: Fish, chicken, beans, and nuts are all healthy, versatile protein sources. Limit red meat. You need carbohydrates for energy! The type of carbohydrate is more important than the amount. Choose carbohydrates such as vegetables, fruits, whole grains, beans, and nuts in the place of white rice, white pasta, potatoes (baked or fried), macaroni and cheese, cakes, cookies, and donuts.  If youre thirsty, drink water. Coffee and tea are good in moderation, but skip sugary drinks and limit milk and dairy products to one or two daily servings. Keep sugar intake at 6 teaspoons or 24 grams or LESS       Exercise: Aim for 150 min of moderate intensity exercise  weekly for heart health, and weights twice weekly for bone health Stay active - any steps are better than no steps! Aim for 7-9 hours of sleep daily       Mediterranean Diet A Mediterranean diet refers to food and lifestyle choices that are based on the traditions of countries located on the [employer]. It focuses on eating more fruits, vegetables, whole grains, beans, nuts, seeds, and heart-healthy fats, and eating less dairy, meat, eggs, and processed foods with added sugar, salt, and fat. This way of eating has been shown to help prevent certain conditions and improve outcomes for people who have chronic diseases, like kidney disease and heart disease. What are tips for following this plan? Reading food labels Check the serving size of packaged foods. For foods such as rice and pasta, the serving size refers to the amount of cooked product, not dry. Check the total fat in packaged foods. Avoid foods that have saturated fat or trans fats. Check the ingredient list for added sugars, such as corn syrup. Shopping  Buy a variety of foods that offer a balanced diet, including: Fresh fruits and vegetables (produce). Grains, beans, nuts, and seeds. Some of these may be available in unpackaged forms or large amounts (in bulk). Fresh seafood. Poultry and eggs. Low-fat dairy products. Buy whole ingredients instead of prepackaged foods. Buy fresh fruits and vegetables in-season from local farmers markets. Buy plain frozen fruits and vegetables. If you do not have access to quality fresh seafood, buy precooked frozen shrimp or canned fish, such as tuna, salmon, or sardines. Stock your pantry so you always have certain foods on hand, such as olive oil, canned tuna, canned tomatoes, rice, pasta, and beans. Cooking Cook foods with extra-virgin olive oil instead of using butter or other vegetable oils. Have meat as a side dish, and have vegetables or grains as your main dish. This means having meat in small portions or adding small amounts of meat to foods like pasta or stew. Use beans or vegetables instead of meat in common dishes like chili or  lasagna. Experiment with different cooking methods. Try roasting, broiling, steaming, and sauting vegetables. Add frozen vegetables to soups, stews, pasta, or rice. Add nuts or seeds for added healthy fats and plant protein at each meal. You can add these to yogurt, salads, or vegetable dishes. Marinate fish or vegetables using olive oil, lemon juice, garlic, and fresh herbs. Meal planning Plan to eat one vegetarian meal one day each week. Try to work up to two vegetarian meals, if possible. Eat seafood two or more times a week. Have healthy snacks readily available, such as: Vegetable sticks with hummus. Greek yogurt. Fruit and nut trail mix. Eat balanced meals throughout the week. This includes: Fruit: 2-3 servings a day. Vegetables: 4-5 servings a day. Low-fat dairy: 2 servings a day. Fish, poultry, or lean meat: 1 serving a day. Beans and legumes: 2 or more servings a week. Nuts and seeds: 1-2 servings a day. Whole grains: 6-8 servings a day. Extra-virgin olive oil: 3-4 servings a day. Limit red meat and sweets to only a few servings a month. Lifestyle  Cook and eat meals together with your family, when possible. Drink enough fluid to keep your urine pale yellow. Be physically active every day. This includes: Aerobic exercise like running or swimming. Leisure activities like gardening, walking, or housework. Get 7-8 hours of sleep each night. If recommended by your health care provider, drink red wine in moderation. This means 1 glass  a day for nonpregnant women and 2 glasses a day for men. A glass of wine equals 5 oz (150 mL). What foods should I eat? Fruits Apples. Apricots. Avocado. Berries. Bananas. Cherries. Dates. Figs. Grapes. Lemons. Melon. Oranges. Peaches. Plums. Pomegranate. Vegetables Artichokes. Beets. Broccoli. Cabbage. Carrots. Eggplant. Green beans. Chard. Kale. Spinach. Onions. Leeks. Peas. Squash. Tomatoes. Peppers. Radishes. Grains Whole-grain pasta. Brown  rice. Bulgur wheat. Polenta. Couscous. Whole-wheat bread. [place]. Meats and other proteins Beans. Almonds. Sunflower seeds. Pine nuts. Peanuts. [place]. Salmon. Scallops. Shrimp. [place]. Tilapia. Clams. Oysters. Eggs. Poultry without skin. Dairy Low-fat milk. Cheese. Greek yogurt. Fats and oils Extra-virgin olive oil. Avocado oil. Grapeseed oil. Beverages Water. Red wine. Herbal tea. Sweets and desserts Greek yogurt with honey. Baked apples. Poached pears. Trail mix. Seasonings and condiments Basil. Cilantro. Coriander. Cumin. Mint. Parsley. Sage. Rosemary. Tarragon. Garlic. Oregano. Thyme. Pepper. Balsamic vinegar. Tahini. Hummus. Tomato sauce. Olives. Mushrooms. The items listed above may not be a complete list of foods and beverages you can eat. Contact a dietitian for more information. What foods should I limit? This is a list of foods that should be eaten rarely or only on special occasions. Fruits Fruit canned in syrup. Vegetables Deep-fried potatoes (french fries). Grains Prepackaged pasta or rice dishes. Prepackaged cereal with added sugar. Prepackaged snacks with added sugar. Meats and other proteins Beef. Pork. Lamb. Poultry with skin. Hot dogs. Berniece Salines. Dairy Ice cream. Sour cream. Whole milk. Fats and oils Butter. Canola oil. Vegetable oil. Beef fat (tallow). Lard. Beverages Juice. Sugar-sweetened soft drinks. Beer. Liquor and spirits. Sweets and desserts Cookies. Cakes. Pies. Candy. Seasonings and condiments Mayonnaise. Pre-made sauces and marinades. The items listed above may not be a complete list of foods and beverages you should limit. Contact a dietitian for more information. Summary The Mediterranean diet includes both food and lifestyle choices. Eat a variety of fresh fruits and vegetables, beans, nuts, seeds, and whole grains. Limit the amount of red meat and sweets that you eat. If recommended by your health care provider, drink red wine in moderation.  This means 1 glass a day for nonpregnant women and 2 glasses a day for men. A glass of wine equals 5 oz (150 mL). This information is not intended to replace advice given to you by your health care provider. Make sure you discuss any questions you have with your health care provider. Document Revised: 05/12/2019 Document Reviewed: 03/09/2019 Elsevier Patient Education  [place]         Signed, Emmaline Life, NP  04/06/2022 10:05 AM    [place]

## 2022-04-06 ENCOUNTER — Ambulatory Visit: Payer: 59 | Attending: Nurse Practitioner | Admitting: Nurse Practitioner

## 2022-04-06 ENCOUNTER — Encounter: Payer: Self-pay | Admitting: Nurse Practitioner

## 2022-04-06 VITALS — BP 112/70 | HR 66 | Ht 64.0 in | Wt 170.4 lb

## 2022-04-06 DIAGNOSIS — Z940 Kidney transplant status: Secondary | ICD-10-CM | POA: Diagnosis not present

## 2022-04-06 DIAGNOSIS — I428 Other cardiomyopathies: Secondary | ICD-10-CM

## 2022-04-06 DIAGNOSIS — I10 Essential (primary) hypertension: Secondary | ICD-10-CM | POA: Diagnosis not present

## 2022-04-06 DIAGNOSIS — E785 Hyperlipidemia, unspecified: Secondary | ICD-10-CM

## 2022-04-06 NOTE — Patient Instructions (Signed)
Medication Instructions:   Your physician recommends that you continue on your current medications as directed. Please refer to the Current Medication list given to you today.   *If you need a refill on your cardiac medications before your next appointment, please call your pharmacy*   Lab Work:  None ordered.  If you have labs (blood work) drawn today and your tests are completely normal, you will receive your results only by: [place] (if you have MyChart) OR A paper copy in the mail If you have any lab test that is abnormal or we need to change your treatment, we will call you to review the results.   Testing/Procedures:   None ordered.   Follow-Up: At [facility], you and your health needs are our priority.  As part of our continuing mission to provide you with exceptional heart care, we have created designated Provider Care Teams.  These Care Teams include your primary Cardiologist (physician) and Advanced Practice Providers (APPs -  Physician Assistants and Nurse Practitioners) who all work together to provide you with the care you need, when you need it.  We recommend signing up for the patient portal called "MyChart".  Sign up information is provided on this After Visit Summary.  MyChart is used to connect with patients for Virtual Visits (Telemedicine).  Patients are able to view lab/test results, encounter notes, upcoming appointments, etc.  Non-urgent messages can be sent to your provider as well.   To learn more about what you can do with MyChart, go to [email or web address].    Your next appointment:   1 year(s)  The format for your next appointment:   In Person  Provider:   Lauree Chandler, MD    Other Instructions  Your physician wants you to follow-up in: 1 year with Dr. Angelena Form.  You will receive a reminder letter in the mail two months in advance. If you don't receive a letter, please call our office to schedule the follow-up  appointment.  Adopting a Healthy Lifestyle.   Weight: Know what a healthy weight is for you (roughly BMI <25) and aim to maintain this. You can calculate your body mass index on your smart phone  Diet: Aim for 7+ servings of fruits and vegetables daily Limit animal fats in diet for cholesterol and heart health - choose grass fed whenever available Avoid highly processed foods (fast food burgers, tacos, fried chicken, pizza, hot dogs, french fries)  Saturated fat comes in the form of butter, lard, coconut oil, margarine, partially hydrogenated oils, and fat in meat. These increase your risk of cardiovascular disease.  Use healthy plant oils, such as olive, canola, soy, corn, sunflower and peanut.  Whole foods such as fruits, vegetables and whole grains have fiber  Men need > 38 grams of fiber per day Women need > 25 grams of fiber per day  Load up on vegetables and fruits - one-half of your plate: Aim for color and variety, and remember that potatoes dont count. Go for whole grains - one-quarter of your plate: Whole wheat, barley, wheat berries, quinoa, oats, brown rice, and foods made with them. If you want pasta, go with whole wheat pasta. Protein power - one-quarter of your plate: Fish, chicken, beans, and nuts are all healthy, versatile protein sources. Limit red meat. You need carbohydrates for energy! The type of carbohydrate is more important than the amount. Choose carbohydrates such as vegetables, fruits, whole grains, beans, and nuts in the place of white rice, white pasta,  potatoes (baked or fried), macaroni and cheese, cakes, cookies, and donuts.  If youre thirsty, drink water. Coffee and tea are good in moderation, but skip sugary drinks and limit milk and dairy products to one or two daily servings. Keep sugar intake at 6 teaspoons or 24 grams or LESS       Exercise: Aim for 150 min of moderate intensity exercise weekly for heart health, and weights twice weekly for bone  health Stay active - any steps are better than no steps! Aim for 7-9 hours of sleep daily      Mediterranean Diet A Mediterranean diet refers to food and lifestyle choices that are based on the traditions of countries located on the [employer]. It focuses on eating more fruits, vegetables, whole grains, beans, nuts, seeds, and heart-healthy fats, and eating less dairy, meat, eggs, and processed foods with added sugar, salt, and fat. This way of eating has been shown to help prevent certain conditions and improve outcomes for people who have chronic diseases, like kidney disease and heart disease. What are tips for following this plan? Reading food labels Check the serving size of packaged foods. For foods such as rice and pasta, the serving size refers to the amount of cooked product, not dry. Check the total fat in packaged foods. Avoid foods that have saturated fat or trans fats. Check the ingredient list for added sugars, such as corn syrup. Shopping  Buy a variety of foods that offer a balanced diet, including: Fresh fruits and vegetables (produce). Grains, beans, nuts, and seeds. Some of these may be available in unpackaged forms or large amounts (in bulk). Fresh seafood. Poultry and eggs. Low-fat dairy products. Buy whole ingredients instead of prepackaged foods. Buy fresh fruits and vegetables in-season from local farmers markets. Buy plain frozen fruits and vegetables. If you do not have access to quality fresh seafood, buy precooked frozen shrimp or canned fish, such as tuna, salmon, or sardines. Stock your pantry so you always have certain foods on hand, such as olive oil, canned tuna, canned tomatoes, rice, pasta, and beans. Cooking Cook foods with extra-virgin olive oil instead of using butter or other vegetable oils. Have meat as a side dish, and have vegetables or grains as your main dish. This means having meat in small portions or adding small amounts of meat to  foods like pasta or stew. Use beans or vegetables instead of meat in common dishes like chili or lasagna. Experiment with different cooking methods. Try roasting, broiling, steaming, and sauting vegetables. Add frozen vegetables to soups, stews, pasta, or rice. Add nuts or seeds for added healthy fats and plant protein at each meal. You can add these to yogurt, salads, or vegetable dishes. Marinate fish or vegetables using olive oil, lemon juice, garlic, and fresh herbs. Meal planning Plan to eat one vegetarian meal one day each week. Try to work up to two vegetarian meals, if possible. Eat seafood two or more times a week. Have healthy snacks readily available, such as: Vegetable sticks with hummus. Greek yogurt. Fruit and nut trail mix. Eat balanced meals throughout the week. This includes: Fruit: 2-3 servings a day. Vegetables: 4-5 servings a day. Low-fat dairy: 2 servings a day. Fish, poultry, or lean meat: 1 serving a day. Beans and legumes: 2 or more servings a week. Nuts and seeds: 1-2 servings a day. Whole grains: 6-8 servings a day. Extra-virgin olive oil: 3-4 servings a day. Limit red meat and sweets to only a few servings  a month. Lifestyle  Cook and eat meals together with your family, when possible. Drink enough fluid to keep your urine pale yellow. Be physically active every day. This includes: Aerobic exercise like running or swimming. Leisure activities like gardening, walking, or housework. Get 7-8 hours of sleep each night. If recommended by your health care provider, drink red wine in moderation. This means 1 glass a day for nonpregnant women and 2 glasses a day for men. A glass of wine equals 5 oz (150 mL). What foods should I eat? Fruits Apples. Apricots. Avocado. Berries. Bananas. Cherries. Dates. Figs. Grapes. Lemons. Melon. Oranges. Peaches. Plums. Pomegranate. Vegetables Artichokes. Beets. Broccoli. Cabbage. Carrots. Eggplant. Green beans. Chard. Kale.  Spinach. Onions. Leeks. Peas. Squash. Tomatoes. Peppers. Radishes. Grains Whole-grain pasta. Brown rice. Bulgur wheat. Polenta. Couscous. Whole-wheat bread. [place]. Meats and other proteins Beans. Almonds. Sunflower seeds. Pine nuts. Peanuts. [place]. Salmon. Scallops. Shrimp. [place]. Tilapia. Clams. Oysters. Eggs. Poultry without skin. Dairy Low-fat milk. Cheese. Greek yogurt. Fats and oils Extra-virgin olive oil. Avocado oil. Grapeseed oil. Beverages Water. Red wine. Herbal tea. Sweets and desserts Greek yogurt with honey. Baked apples. Poached pears. Trail mix. Seasonings and condiments Basil. Cilantro. Coriander. Cumin. Mint. Parsley. Sage. Rosemary. Tarragon. Garlic. Oregano. Thyme. Pepper. Balsamic vinegar. Tahini. Hummus. Tomato sauce. Olives. Mushrooms. The items listed above may not be a complete list of foods and beverages you can eat. Contact a dietitian for more information. What foods should I limit? This is a list of foods that should be eaten rarely or only on special occasions. Fruits Fruit canned in syrup. Vegetables Deep-fried potatoes (french fries). Grains Prepackaged pasta or rice dishes. Prepackaged cereal with added sugar. Prepackaged snacks with added sugar. Meats and other proteins Beef. Pork. Lamb. Poultry with skin. Hot dogs. Berniece Salines. Dairy Ice cream. Sour cream. Whole milk. Fats and oils Butter. Canola oil. Vegetable oil. Beef fat (tallow). Lard. Beverages Juice. Sugar-sweetened soft drinks. Beer. Liquor and spirits. Sweets and desserts Cookies. Cakes. Pies. Candy. Seasonings and condiments Mayonnaise. Pre-made sauces and marinades. The items listed above may not be a complete list of foods and beverages you should limit. Contact a dietitian for more information. Summary The Mediterranean diet includes both food and lifestyle choices. Eat a variety of fresh fruits and vegetables, beans, nuts, seeds, and whole grains. Limit the amount of red meat  and sweets that you eat. If recommended by your health care provider, drink red wine in moderation. This means 1 glass a day for nonpregnant women and 2 glasses a day for men. A glass of wine equals 5 oz (150 mL). This information is not intended to replace advice given to you by your health care provider. Make sure you discuss any questions you have with your health care provider. Document Revised: 05/12/2019 Document Reviewed: 03/09/2019 Elsevier Patient Education  [place]

## 2022-07-02 ENCOUNTER — Encounter: Payer: 59 | Admitting: Podiatry

## 2022-07-09 ENCOUNTER — Encounter: Payer: 59 | Admitting: Podiatry

## 2022-07-23 ENCOUNTER — Encounter: Payer: 59 | Admitting: Podiatry

## 2022-11-03 DIAGNOSIS — R0981 Nasal congestion: Secondary | ICD-10-CM | POA: Diagnosis not present

## 2022-12-11 DIAGNOSIS — Z940 Kidney transplant status: Secondary | ICD-10-CM | POA: Diagnosis not present

## 2023-01-04 DIAGNOSIS — D849 Immunodeficiency, unspecified: Secondary | ICD-10-CM | POA: Diagnosis not present

## 2023-01-04 DIAGNOSIS — E7800 Pure hypercholesterolemia, unspecified: Secondary | ICD-10-CM | POA: Diagnosis not present

## 2023-01-04 DIAGNOSIS — Z940 Kidney transplant status: Secondary | ICD-10-CM | POA: Diagnosis not present

## 2023-01-04 DIAGNOSIS — E213 Hyperparathyroidism, unspecified: Secondary | ICD-10-CM | POA: Diagnosis not present

## 2023-03-04 DIAGNOSIS — E785 Hyperlipidemia, unspecified: Secondary | ICD-10-CM | POA: Diagnosis not present

## 2023-03-04 DIAGNOSIS — N390 Urinary tract infection, site not specified: Secondary | ICD-10-CM | POA: Diagnosis not present

## 2023-03-04 DIAGNOSIS — Z940 Kidney transplant status: Secondary | ICD-10-CM | POA: Diagnosis not present

## 2023-03-04 DIAGNOSIS — N189 Chronic kidney disease, unspecified: Secondary | ICD-10-CM | POA: Diagnosis not present

## 2023-03-04 DIAGNOSIS — M109 Gout, unspecified: Secondary | ICD-10-CM | POA: Diagnosis not present

## 2023-03-10 DIAGNOSIS — E785 Hyperlipidemia, unspecified: Secondary | ICD-10-CM | POA: Diagnosis not present

## 2023-03-10 DIAGNOSIS — D649 Anemia, unspecified: Secondary | ICD-10-CM | POA: Diagnosis not present

## 2023-03-10 DIAGNOSIS — I10 Essential (primary) hypertension: Secondary | ICD-10-CM | POA: Diagnosis not present

## 2023-03-10 DIAGNOSIS — Z940 Kidney transplant status: Secondary | ICD-10-CM | POA: Diagnosis not present

## 2023-03-26 DIAGNOSIS — J018 Other acute sinusitis: Secondary | ICD-10-CM | POA: Diagnosis not present

## 2023-04-27 DIAGNOSIS — R509 Fever, unspecified: Secondary | ICD-10-CM | POA: Diagnosis not present

## 2023-04-27 DIAGNOSIS — J45901 Unspecified asthma with (acute) exacerbation: Secondary | ICD-10-CM | POA: Diagnosis not present

## 2023-04-27 DIAGNOSIS — D899 Disorder involving the immune mechanism, unspecified: Secondary | ICD-10-CM | POA: Diagnosis not present

## 2023-04-27 DIAGNOSIS — R051 Acute cough: Secondary | ICD-10-CM | POA: Diagnosis not present

## 2023-04-27 DIAGNOSIS — J988 Other specified respiratory disorders: Secondary | ICD-10-CM | POA: Diagnosis not present

## 2023-05-13 DIAGNOSIS — R051 Acute cough: Secondary | ICD-10-CM | POA: Diagnosis not present

## 2023-05-13 DIAGNOSIS — J988 Other specified respiratory disorders: Secondary | ICD-10-CM | POA: Diagnosis not present

## 2023-05-13 DIAGNOSIS — Z03818 Encounter for observation for suspected exposure to other biological agents ruled out: Secondary | ICD-10-CM | POA: Diagnosis not present

## 2023-05-13 NOTE — Progress Notes (Signed)
Cardiology Office Note:  .   Date:  05/17/2023  ID:  Erin Ward, DOB [birth year], MRN [id number] PCP: Lupita Raider, MD  [facility] Providers Cardiologist:  Verne Carrow, MD    Patient Profile: .      PMH NICM Hypertension Hyperlipidemia CKD s/p renal transplant 2013 Anemia Anxiety  Seen remotely by Dr. Anne Fu in 2014 for management of NICM and hypertension.   Referred to cardiology and seen by Dr. Clifton James 05/22/2019. History of NICM in 2011 with LVEF 20% at that time. She underwent renal transplant in 2013.   Echocardiogram 2012 showed normal LV function on medical therapy. She reported occasional DOE. Her daughter is a Engineer, civil (consulting) in [employer]).  2D echo 06/01/2019 revealed normal LVEF 65 to 70%, G1 DD, mild LAE, normal RV, no significant valve disease.  She was not seen again until 04/06/2022 at which time she presented for preoperative cardiac evaluation for upcoming foot surgery.  At the office visit with me, she reported she had decided to cancel the surgery due to recommendation from her nephrologist not to hold posttransplant medications for elective surgery.  She reported occasional lightheadedness particularly after taking her morning dose of carvedilol. She had decreased carvedilol to 25 mg in the mornings and 37.5 mg in the evenings.  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  She has been on iron infusions and hemoglobin has improved.  BP was well-controlled at the visit.  LDL was 96 and she wanted to work on diet and exercise improvements before increasing rosuvastatin.  She was advised to return in 1 year for follow-up.       History of Present Illness: .   Erin Ward is a very pleasant 63 y.o. female who is here today for overdue follow-up. She is tearful because she is feeling poorly secondary to a recurrent upper respiratory infection. Symptoms initially started a couple of weeks  ago, and improved after a course of doxycycline and prednisone. However, symptoms returned including coughing, congestion, and intermittent fever. She returned to PCP and was prescribed a Z-Pak and phenegrin cough syrup, which she only takes at night due to its sedative effects. She reported feeling tired and still experiencing a "hacking cough", but noted a slight improvement with no fever in the past two days. Reports occasional episodes of heart racing, with HR up to 115 bmp occasionally. Home BP was 138/94 with a pulse rate of 83 this morning. She has avoided DM medications with the exception of the Rx cough syrup. She has used albuterol for wheezing on a few occasions. Kidney function is stable. She is sad that her nephrologist is retiring soon. She denies chest pain, shortness of breath, lower extremity edema, hemoptysis, presyncope, syncope, orthopnea, and PND.   Discussed the use of AI scribe software for clinical note transcription with the patient, who gave verbal consent to proceed.   ROS: See HPI       Studies Reviewed: Marland Kitchen   EKG Interpretation Date/Time:  Monday May 17 2023 08:11:54 EST Ventricular Rate:  84 PR Interval:  162 QRS Duration:  64 QT Interval:  360 QTC Calculation: 425 R Axis:   76  Text Interpretation: Normal sinus rhythm Normal ECG No ST abnormality Confirmed by Eligha Bridegroom [phone]) on 05/17/2023 8:18:12 AM    Risk Assessment/Calculations:             Physical Exam:   VS:  BP Marland Kitchen)  108/50   Pulse 84   Ht 5\' 4"  (1.626 m)   Wt 169 lb 12.8 oz (77 kg)   SpO2 96%   BMI 29.15 kg/m    Wt Readings from Last 3 Encounters:  05/17/23 169 lb 12.8 oz (77 kg)  04/06/22 170 lb 6.4 oz (77.3 kg)  01/06/22 165 lb (74.8 kg)    GEN: Well nourished, well developed in no acute distress NECK: No JVD; No carotid bruits CARDIAC: RRR, no murmurs, rubs, gallops RESPIRATORY:  Clear to auscultation without rales, wheezing or rhonchi  ABDOMEN: Soft, non-tender,  non-distended EXTREMITIES:  No edema; No deformity     ASSESSMENT AND PLAN: .    NICM: History of reduced LVEF 20% in 2011 normalization of EF in 2012 on GDMT. Most recent echo 06/01/2019 with normal LVEF, G1DD, no significant valve disease. She has had recent respiratory infection that has caused coughing, fever, and congestion.  She reports she was not experiencing shortness of breath prior to this extended illness.  She denies weight gain, edema, orthopnea, PND.  Have encouraged her to notify us if shortness of breath continues beyond recovery from recent respiratory illness. Would be inclined to repeat echo if symptoms persist.   Hypertension: BP is well controlled  in clinic. She reports occasional elevated readings at home although it does not sound like these are consistent. Renal function stable on labs completed 03/04/23.  No medication changes today.  Continue carvedilol, losartan.   Hyperlipidemia LDL goal < 70: Lipid panel completed 03/04/2023 with total cholesterol 202, HDL 64, LDL 109, and triglycerides 167.  We do lengthy discussion about LDL goal.  No prior history of screening for CAD.  She is agreeable to get CT calcium score for further risk stratification.  Plan to continue rosuvastatin 5 mg and determine additional treatment based on CT score.  CKD/History of renal transplant: She continues regular follow-up with [facility].  She reports stable renal function at last office visit 02/2023. No acute concerns today.        Disposition: 6 months with Dr. Clifton James or me  Signed, Eligha Bridegroom, NP-C

## 2023-05-17 ENCOUNTER — Ambulatory Visit: Payer: BC Managed Care – PPO | Attending: Nurse Practitioner | Admitting: Nurse Practitioner

## 2023-05-17 ENCOUNTER — Encounter: Payer: Self-pay | Admitting: Nurse Practitioner

## 2023-05-17 VITALS — BP 108/50 | HR 84 | Ht 64.0 in | Wt 169.8 lb

## 2023-05-17 DIAGNOSIS — E785 Hyperlipidemia, unspecified: Secondary | ICD-10-CM

## 2023-05-17 DIAGNOSIS — I10 Essential (primary) hypertension: Secondary | ICD-10-CM | POA: Diagnosis not present

## 2023-05-17 DIAGNOSIS — I428 Other cardiomyopathies: Secondary | ICD-10-CM

## 2023-05-17 DIAGNOSIS — Z7189 Other specified counseling: Secondary | ICD-10-CM | POA: Diagnosis not present

## 2023-05-17 DIAGNOSIS — Z940 Kidney transplant status: Secondary | ICD-10-CM

## 2023-05-17 DIAGNOSIS — I420 Dilated cardiomyopathy: Secondary | ICD-10-CM

## 2023-05-17 NOTE — Patient Instructions (Signed)
Medication Instructions:   Your physician recommends that you continue on your current medications as directed. Please refer to the Current Medication list given to you today.   *If you need a refill on your cardiac medications before your next appointment, please call your pharmacy*   Lab Work:  None ordered.  If you have labs (blood work) drawn today and your tests are completely normal, you will receive your results only by: MyChart [place] (if you have MyChart) OR A paper copy in the mail If you have any lab test that is abnormal or we need to change your treatment, we will call you to review the results.   Testing/Procedures:  Lebron Conners  has ordered a CT coronary calcium score.   Test locations:  [facility]  This is $99 out of pocket.   Coronary CalciumScan A coronary calcium scan is an imaging test used to look for deposits of calcium and other fatty materials (plaques) in the inner lining of the blood vessels of the heart (coronary arteries). These deposits of calcium and plaques can partly clog and narrow the coronary arteries without producing any symptoms or warning signs. This puts a person at risk for a heart attack. This test can detect these deposits before symptoms develop. Tell a health care provider about: Any allergies you have. All medicines you are taking, including vitamins, herbs, eye drops, creams, and over-the-counter medicines. Any problems you or family members have had with anesthetic medicines. Any blood disorders you have. Any surgeries you have had. Any medical conditions you have. Whether you are pregnant or may be pregnant. What are the risks? Generally, this is a safe procedure. However, problems may occur, including: Harm to a pregnant woman and her unborn baby. This test involves the use of radiation. Radiation exposure can be dangerous to a pregnant woman and her unborn baby. If you are pregnant, you generally should not have this  procedure done. Slight increase in the risk of cancer. This is because of the radiation involved in the test. What happens before the procedure? No preparation is needed for this procedure. What happens during the procedure? You will undress and remove any jewelry around your neck or chest. You will put on a hospital gown. Sticky electrodes will be placed on your chest. The electrodes will be connected to an electrocardiogram (ECG) machine to record a tracing of the electrical activity of your heart. A CT scanner will take pictures of your heart. During this time, you will be asked to lie still and hold your breath for 2-3 seconds while a picture of your heart is being taken. The procedure may vary among health care providers and hospitals. What happens after the procedure? You can get dressed. You can return to your normal activities. It is up to you to get the results of your test. Ask your health care provider, or the department that is doing the test, when your results will be ready. Summary A coronary calcium scan is an imaging test used to look for deposits of calcium and other fatty materials (plaques) in the inner lining of the blood vessels of the heart (coronary arteries). Generally, this is a safe procedure. Tell your health care provider if you are pregnant or may be pregnant. No preparation is needed for this procedure. A CT scanner will take pictures of your heart. You can return to your normal activities after the scan is done. This information is not intended to replace advice given to you by your  health care provider. Make sure you discuss any questions you have with your health care provider. Document Released: 10/03/2007 Document Revised: 02/24/2016 Document Reviewed: 02/24/2016 Elsevier Interactive Patient Education  2017 [employer].    Follow-Up: At [facility], you and your health needs are our priority.  As part of our continuing mission to provide you  with exceptional heart care, we have created designated Provider Care Teams.  These Care Teams include your primary Cardiologist (physician) and Advanced Practice Providers (APPs -  Physician Assistants and Nurse Practitioners) who all work together to provide you with the care you need, when you need it.  We recommend signing up for the patient portal called "MyChart".  Sign up information is provided on this After Visit Summary.  MyChart is used to connect with patients for Virtual Visits (Telemedicine).  Patients are able to view lab/test results, encounter notes, upcoming appointments, etc.  Non-urgent messages can be sent to your provider as well.   To learn more about what you can do with MyChart, go to [email or web address].    Your next appointment:   6 month(s)  Provider:   Verne Carrow, MD     Other Instructions    1st Floor: - Lobby - Registration  - Pharmacy  - Lab - Cafe  2nd Floor: - PV Lab - Diagnostic Testing (echo, CT, nuclear med)  3rd Floor: - Vacant  4th Floor: - TCTS (cardiothoracic surgery) - AFib Clinic - Structural Heart Clinic - Vascular Surgery  - Vascular Ultrasound  5th Floor: - HeartCare Cardiology (general and EP) - Clinical Pharmacy for coumadin, hypertension, lipid, weight-loss medications, and med management appointments    Valet parking services will be available as well.

## 2023-05-19 DIAGNOSIS — D225 Melanocytic nevi of trunk: Secondary | ICD-10-CM | POA: Diagnosis not present

## 2023-05-19 DIAGNOSIS — L814 Other melanin hyperpigmentation: Secondary | ICD-10-CM | POA: Diagnosis not present

## 2023-05-19 DIAGNOSIS — L821 Other seborrheic keratosis: Secondary | ICD-10-CM | POA: Diagnosis not present

## 2023-05-19 DIAGNOSIS — L578 Other skin changes due to chronic exposure to nonionizing radiation: Secondary | ICD-10-CM | POA: Diagnosis not present

## 2023-05-31 ENCOUNTER — Other Ambulatory Visit (HOSPITAL_COMMUNITY): Payer: BC Managed Care – PPO

## 2023-06-15 DIAGNOSIS — Z01419 Encounter for gynecological examination (general) (routine) without abnormal findings: Secondary | ICD-10-CM | POA: Diagnosis not present

## 2023-06-15 DIAGNOSIS — Z1231 Encounter for screening mammogram for malignant neoplasm of breast: Secondary | ICD-10-CM | POA: Diagnosis not present

## 2023-06-15 DIAGNOSIS — Z1331 Encounter for screening for depression: Secondary | ICD-10-CM | POA: Diagnosis not present

## 2023-08-11 DIAGNOSIS — Z131 Encounter for screening for diabetes mellitus: Secondary | ICD-10-CM | POA: Diagnosis not present

## 2023-08-11 DIAGNOSIS — Z0000 Encounter for general adult medical examination without abnormal findings: Secondary | ICD-10-CM | POA: Diagnosis not present

## 2023-08-11 DIAGNOSIS — D899 Disorder involving the immune mechanism, unspecified: Secondary | ICD-10-CM | POA: Diagnosis not present

## 2023-08-11 DIAGNOSIS — F411 Generalized anxiety disorder: Secondary | ICD-10-CM | POA: Diagnosis not present

## 2023-08-11 DIAGNOSIS — Z940 Kidney transplant status: Secondary | ICD-10-CM | POA: Diagnosis not present

## 2023-08-11 DIAGNOSIS — I129 Hypertensive chronic kidney disease with stage 1 through stage 4 chronic kidney disease, or unspecified chronic kidney disease: Secondary | ICD-10-CM | POA: Diagnosis not present

## 2023-08-25 DIAGNOSIS — Z940 Kidney transplant status: Secondary | ICD-10-CM | POA: Diagnosis not present

## 2023-08-25 DIAGNOSIS — N189 Chronic kidney disease, unspecified: Secondary | ICD-10-CM | POA: Diagnosis not present

## 2023-08-31 DIAGNOSIS — E119 Type 2 diabetes mellitus without complications: Secondary | ICD-10-CM | POA: Diagnosis not present

## 2023-09-02 DIAGNOSIS — D649 Anemia, unspecified: Secondary | ICD-10-CM | POA: Diagnosis not present

## 2023-09-02 DIAGNOSIS — Z940 Kidney transplant status: Secondary | ICD-10-CM | POA: Diagnosis not present

## 2023-09-02 DIAGNOSIS — E785 Hyperlipidemia, unspecified: Secondary | ICD-10-CM | POA: Diagnosis not present

## 2023-09-02 DIAGNOSIS — I10 Essential (primary) hypertension: Secondary | ICD-10-CM | POA: Diagnosis not present

## 2023-10-14 DIAGNOSIS — R0981 Nasal congestion: Secondary | ICD-10-CM | POA: Diagnosis not present

## 2023-10-14 DIAGNOSIS — J988 Other specified respiratory disorders: Secondary | ICD-10-CM | POA: Diagnosis not present

## 2023-10-14 DIAGNOSIS — D899 Disorder involving the immune mechanism, unspecified: Secondary | ICD-10-CM | POA: Diagnosis not present

## 2023-10-14 DIAGNOSIS — Z87891 Personal history of nicotine dependence: Secondary | ICD-10-CM | POA: Diagnosis not present

## 2023-11-24 ENCOUNTER — Ambulatory Visit (INDEPENDENT_AMBULATORY_CARE_PROVIDER_SITE_OTHER): Payer: Self-pay | Admitting: Audiology

## 2023-11-24 DIAGNOSIS — H903 Sensorineural hearing loss, bilateral: Secondary | ICD-10-CM

## 2023-11-26 NOTE — Progress Notes (Signed)
  [address] [place], [place] [address] [phone]  Hearing Aid Check     Erin Ward came to drop off her hearing aids and charger.      Right Left  Hearing aid manufacturer Oticon Real 2 miniRITE SN: [id number] Oticon Real 2 miniRITE SN: [id number]  Hearing aid style Receiver in the canal Receiver in the canal  Hearing aid battery rechargeable rechargeable  Receiver    Dome/ custom earpiece Medium open dome Medium open dome  Retention wire no no  Warranty expiration date 05-14-25 05-14-25  Loss and Damage unknown unknown  Additional accessories Expiration date    Initial fitting date 04-15-22 04-15-22  Device was [facility]: [facility]    Chief complaint: Patient reports is not charging well.  Actions taken: Inspection of the device and listening check showed that it charged well while in office. Called patient and agreed to have it sent out for repair knowing it will take about 2 weeks.   Services fee: $0 was paid at checkout.     Recommend: Return for a hearing aid check , as needed. Return for a hearing evaluation and to see an ENT, if concerns with hearing changes arise.    Zani Kyllonen MARIE LEROUX-MARTINEZ, AUD

## 2023-12-08 ENCOUNTER — Ambulatory Visit (INDEPENDENT_AMBULATORY_CARE_PROVIDER_SITE_OTHER): Payer: Self-pay | Admitting: Audiology

## 2023-12-08 DIAGNOSIS — H903 Sensorineural hearing loss, bilateral: Secondary | ICD-10-CM

## 2023-12-09 DIAGNOSIS — Z940 Kidney transplant status: Secondary | ICD-10-CM | POA: Diagnosis not present

## 2023-12-09 NOTE — Progress Notes (Signed)
  [address] [place], [place] [address] [phone]  Hearing Aid Check     Erin Ward comes as a walk in after she was called to pick up her repaired hearing aids and charger.    Right Left  Hearing aid manufacturer Oticon Real 2 miniRITE SN: [id number] Oticon Real 2 miniRITE SN: [id number]  Hearing aid style Receiver in the canal Receiver in the canal  Hearing aid battery rechargeable rechargeable  Receiver      Dome/ custom earpiece Medium open dome Medium open dome  Retention wire no no  Warranty expiration date 05-14-25 05-14-25  Loss and Damage unknown unknown  Additional accessories Expiration date      Initial fitting date 04-15-22 04-15-22  Device was fit at: [facility]       Actions taken: Her charger worked well while in office to charge the aids after they returned form repair.Programmed aids based on last settings. Provided filters and domes, as requested.   Services fee: $0 was paid at checkout.     Recommend: Return for a hearing aid check , as needed. Return for a hearing evaluation and to see an ENT, if concerns with hearing changes arise.    [name] MARIE LEROUX-MARTINEZ, AUD

## 2023-12-10 ENCOUNTER — Telehealth (INDEPENDENT_AMBULATORY_CARE_PROVIDER_SITE_OTHER): Payer: Self-pay | Admitting: Audiology

## 2023-12-10 NOTE — Telephone Encounter (Signed)
 Pt picked up ha pt states ha keep shutting off for past 2days please call

## 2023-12-15 ENCOUNTER — Telehealth (INDEPENDENT_AMBULATORY_CARE_PROVIDER_SITE_OTHER): Payer: Self-pay | Admitting: Audiology

## 2023-12-15 NOTE — Telephone Encounter (Signed)
 Pt states the borrowed charger works would like for you to call

## 2023-12-27 ENCOUNTER — Telehealth (INDEPENDENT_AMBULATORY_CARE_PROVIDER_SITE_OTHER): Payer: Self-pay | Admitting: Audiology

## 2023-12-27 NOTE — Telephone Encounter (Signed)
 Called patient to inform her that her new HA charger is ready to be picked-up and that when she comes to pick it up to please return the Press photographer.

## 2023-12-28 ENCOUNTER — Ambulatory Visit (INDEPENDENT_AMBULATORY_CARE_PROVIDER_SITE_OTHER): Payer: Self-pay | Admitting: Audiology

## 2023-12-28 DIAGNOSIS — H903 Sensorineural hearing loss, bilateral: Secondary | ICD-10-CM

## 2023-12-28 NOTE — Progress Notes (Signed)
  [address] [place], [place] [address] [phone]  Hearing Aid Check     Ronnita Paz Dorado walked in to pick up her hearing aid charger replacement that [facility] sent.        Right Left  Hearing aid manufacturer O[employer] Real 2 miniRITE SN: [id number] [employer] 2 miniRITE SN: [id number]  Hearing aid style Receiver in the canal Receiver in the canal  Hearing aid battery rechargeable rechargeable  Receiver      Dome/ custom earpiece Medium open dome Medium open dome  Retention wire no no  Warranty expiration date 05-14-25 05-14-25  Loss and Damage unknown unknown  Additional accessories Expiration date      Initial fitting date 04-15-22 04-15-22  [facility]: [facility]       The front desk gave the patient the charger and the patient returned the clinic's charger she was using.     Services fee: $0 was paid at checkout.     Recommend: Return for a hearing aid check , as needed . Return for a hearing evaluation and to see an ENT, if concerns with hearing changes arise.   Yuritzy Zehring MARIE LEROUX-MARTINEZ, AUD

## 2024-01-04 DIAGNOSIS — Z79899 Other long term (current) drug therapy: Secondary | ICD-10-CM | POA: Diagnosis not present

## 2024-01-04 DIAGNOSIS — D849 Immunodeficiency, unspecified: Secondary | ICD-10-CM | POA: Diagnosis not present

## 2024-01-04 DIAGNOSIS — I10 Essential (primary) hypertension: Secondary | ICD-10-CM | POA: Diagnosis not present

## 2024-01-04 DIAGNOSIS — Z940 Kidney transplant status: Secondary | ICD-10-CM | POA: Diagnosis not present

## 2024-01-18 ENCOUNTER — Telehealth (INDEPENDENT_AMBULATORY_CARE_PROVIDER_SITE_OTHER): Payer: Self-pay | Admitting: Audiology

## 2024-01-18 NOTE — Telephone Encounter (Signed)
 The patient called in, she is still having issues where her hearing aids turn off after she puts them in her ears, the left one is worse than the right.  She is planning to drop them off to be checked again.

## 2024-02-01 ENCOUNTER — Telehealth (INDEPENDENT_AMBULATORY_CARE_PROVIDER_SITE_OTHER): Payer: Self-pay

## 2024-02-01 DIAGNOSIS — D899 Disorder involving the immune mechanism, unspecified: Secondary | ICD-10-CM | POA: Diagnosis not present

## 2024-02-01 DIAGNOSIS — J988 Other specified respiratory disorders: Secondary | ICD-10-CM | POA: Diagnosis not present

## 2024-02-01 DIAGNOSIS — J45901 Unspecified asthma with (acute) exacerbation: Secondary | ICD-10-CM | POA: Diagnosis not present

## 2024-02-01 NOTE — Telephone Encounter (Signed)
 Pt stated that trying the remedies that you told her to do for her hearing aids are working it takes a little while to get to working again. She asked if there was anything else she could do to get them working quicker or if she needed to bring them in or if you would just like her to keep on trying the remedies that you had gave her in hopes that they will start working faster.   She did request a call back from you with ideas [phone]  Thank you

## 2024-02-24 DIAGNOSIS — N189 Chronic kidney disease, unspecified: Secondary | ICD-10-CM | POA: Diagnosis not present

## 2024-02-24 DIAGNOSIS — Z940 Kidney transplant status: Secondary | ICD-10-CM | POA: Diagnosis not present

## 2024-03-10 DIAGNOSIS — E785 Hyperlipidemia, unspecified: Secondary | ICD-10-CM | POA: Diagnosis not present

## 2024-03-10 DIAGNOSIS — I10 Essential (primary) hypertension: Secondary | ICD-10-CM | POA: Diagnosis not present

## 2024-03-10 DIAGNOSIS — R7303 Prediabetes: Secondary | ICD-10-CM | POA: Diagnosis not present

## 2024-03-10 DIAGNOSIS — Z940 Kidney transplant status: Secondary | ICD-10-CM | POA: Diagnosis not present

## 2024-03-10 DIAGNOSIS — Z23 Encounter for immunization: Secondary | ICD-10-CM | POA: Diagnosis not present

## 2024-03-30 ENCOUNTER — Telehealth (INDEPENDENT_AMBULATORY_CARE_PROVIDER_SITE_OTHER): Payer: Self-pay

## 2024-03-30 NOTE — Telephone Encounter (Signed)
 I called and spoke with the patient.  She stated that she has done everything she was told to try (removed the app and reinstalled it, rebooted her phone, called the patient help line) and her hearing aids keep shutting off and will not work.  The patient help line told her she needed to contact us .  I offered an appointment for a hearing aid check tomorrow 03/31/2024, but the patient was not able to come.  The patient is going to drop off the hearing aids for you to take a look at them and advise the best course of action.

## 2024-03-30 NOTE — Telephone Encounter (Signed)
 Patient called stated she has brought her hearing aids up to the office a few times. She stated that she would like a call back on what she can do. They keep shutting off on her, and she has done everything she was told to do before but it is not working this time.

## 2024-04-05 ENCOUNTER — Ambulatory Visit (INDEPENDENT_AMBULATORY_CARE_PROVIDER_SITE_OTHER): Payer: Self-pay | Admitting: Audiology

## 2024-04-05 DIAGNOSIS — H903 Sensorineural hearing loss, bilateral: Secondary | ICD-10-CM

## 2024-04-06 NOTE — Progress Notes (Unsigned)
°  [address], [address] [place], [place] [address] [phone]  Hearing Aid Check     Jalesia Loudenslager [place] drops off hearing aids for service.    Right Left   Hearing aid manufacturer Otic[employer] Real 2 miniRITE SN: [id number] O[employer] Real 2 miniRITE SN: [id number]  Hearing aid style Receiver in the canal Receiver in the canal  Hearing aid battery rechargeable rechargeable  Receiver      Dome/ custom earpiece Medium open dome Medium open dome  Retention wire no no  Warranty expiration date 05-14-25 05-14-25  Loss and Damage unknown unknown  Additional accessories Expiration date      Initial fitting date 04-15-22 04-15-22  Device was fit at: [facility]    Chief complaint: Patient reports intermittent (goes in and out).  Previously, Hearing aids were sent previously to the manufacturer for repair in September 2025.    Action taken today:  Inspection of the device and listening check showed that ***   Services fee: $*** was paid at checkout.  Patient was oriented about ***.  Recommend: Return for a hearing aid check ***. Return for a hearing evaluation and to see an ENT, if concerns with hearing changes arise. ***  Giovanie Lefebre MARIE LEROUX-MARTINEZ, AUD

## 2024-04-19 ENCOUNTER — Inpatient Hospital Stay (HOSPITAL_COMMUNITY)
Admission: EM | Admit: 2024-04-19 | Discharge: 2024-04-24 | DRG: 371 | Disposition: A | Discharge status: Home / Self Care | Attending: Internal Medicine | Admitting: Internal Medicine

## 2024-04-19 ENCOUNTER — Encounter (HOSPITAL_COMMUNITY): Payer: Self-pay | Admitting: Hospitalist

## 2024-04-19 ENCOUNTER — Emergency Department (HOSPITAL_COMMUNITY)

## 2024-04-19 ENCOUNTER — Other Ambulatory Visit: Payer: Self-pay

## 2024-04-19 ENCOUNTER — Inpatient Hospital Stay (HOSPITAL_COMMUNITY)

## 2024-04-19 DIAGNOSIS — R0902 Hypoxemia: Secondary | ICD-10-CM | POA: Diagnosis present

## 2024-04-19 DIAGNOSIS — I959 Hypotension, unspecified: Secondary | ICD-10-CM | POA: Diagnosis present

## 2024-04-19 DIAGNOSIS — Z885 Allergy status to narcotic agent status: Secondary | ICD-10-CM

## 2024-04-19 DIAGNOSIS — D6869 Other thrombophilia: Secondary | ICD-10-CM | POA: Diagnosis present

## 2024-04-19 DIAGNOSIS — J45909 Unspecified asthma, uncomplicated: Secondary | ICD-10-CM | POA: Diagnosis present

## 2024-04-19 DIAGNOSIS — K529 Noninfective gastroenteritis and colitis, unspecified: Secondary | ICD-10-CM

## 2024-04-19 DIAGNOSIS — A020 Salmonella enteritis: Principal | ICD-10-CM | POA: Diagnosis present

## 2024-04-19 DIAGNOSIS — E7800 Pure hypercholesterolemia, unspecified: Secondary | ICD-10-CM | POA: Diagnosis present

## 2024-04-19 DIAGNOSIS — F419 Anxiety disorder, unspecified: Secondary | ICD-10-CM | POA: Diagnosis present

## 2024-04-19 DIAGNOSIS — Z87891 Personal history of nicotine dependence: Secondary | ICD-10-CM | POA: Diagnosis not present

## 2024-04-19 DIAGNOSIS — Z79624 Long term (current) use of inhibitors of nucleotide synthesis: Secondary | ICD-10-CM

## 2024-04-19 DIAGNOSIS — I480 Paroxysmal atrial fibrillation: Secondary | ICD-10-CM | POA: Diagnosis present

## 2024-04-19 DIAGNOSIS — D509 Iron deficiency anemia, unspecified: Secondary | ICD-10-CM | POA: Diagnosis present

## 2024-04-19 DIAGNOSIS — D631 Anemia in chronic kidney disease: Secondary | ICD-10-CM | POA: Diagnosis present

## 2024-04-19 DIAGNOSIS — I132 Hypertensive heart and chronic kidney disease with heart failure and with stage 5 chronic kidney disease, or end stage renal disease: Secondary | ICD-10-CM | POA: Diagnosis present

## 2024-04-19 DIAGNOSIS — J189 Pneumonia, unspecified organism: Secondary | ICD-10-CM | POA: Diagnosis present

## 2024-04-19 DIAGNOSIS — I4892 Unspecified atrial flutter: Secondary | ICD-10-CM | POA: Diagnosis present

## 2024-04-19 DIAGNOSIS — I4891 Unspecified atrial fibrillation: Secondary | ICD-10-CM | POA: Diagnosis present

## 2024-04-19 DIAGNOSIS — T8619 Other complication of kidney transplant: Secondary | ICD-10-CM | POA: Diagnosis present

## 2024-04-19 DIAGNOSIS — Z7982 Long term (current) use of aspirin: Secondary | ICD-10-CM

## 2024-04-19 DIAGNOSIS — R002 Palpitations: Secondary | ICD-10-CM | POA: Diagnosis present

## 2024-04-19 DIAGNOSIS — E869 Volume depletion, unspecified: Secondary | ICD-10-CM | POA: Diagnosis present

## 2024-04-19 DIAGNOSIS — N186 End stage renal disease: Secondary | ICD-10-CM | POA: Diagnosis present

## 2024-04-19 DIAGNOSIS — Z888 Allergy status to other drugs, medicaments and biological substances status: Secondary | ICD-10-CM

## 2024-04-19 DIAGNOSIS — Z79899 Other long term (current) drug therapy: Secondary | ICD-10-CM

## 2024-04-19 DIAGNOSIS — Z8249 Family history of ischemic heart disease and other diseases of the circulatory system: Secondary | ICD-10-CM | POA: Diagnosis not present

## 2024-04-19 DIAGNOSIS — N179 Acute kidney failure, unspecified: Secondary | ICD-10-CM | POA: Diagnosis present

## 2024-04-19 DIAGNOSIS — E8770 Fluid overload, unspecified: Secondary | ICD-10-CM | POA: Diagnosis present

## 2024-04-19 DIAGNOSIS — Z7901 Long term (current) use of anticoagulants: Secondary | ICD-10-CM

## 2024-04-19 DIAGNOSIS — Z940 Kidney transplant status: Secondary | ICD-10-CM

## 2024-04-19 DIAGNOSIS — I420 Dilated cardiomyopathy: Secondary | ICD-10-CM | POA: Diagnosis present

## 2024-04-19 DIAGNOSIS — E0781 Sick-euthyroid syndrome: Secondary | ICD-10-CM | POA: Diagnosis present

## 2024-04-19 DIAGNOSIS — Z1152 Encounter for screening for COVID-19: Secondary | ICD-10-CM

## 2024-04-19 DIAGNOSIS — Y830 Surgical operation with transplant of whole organ as the cause of abnormal reaction of the patient, or of later complication, without mention of misadventure at the time of the procedure: Secondary | ICD-10-CM | POA: Diagnosis present

## 2024-04-19 DIAGNOSIS — D84821 Immunodeficiency due to drugs: Secondary | ICD-10-CM | POA: Diagnosis present

## 2024-04-19 DIAGNOSIS — I5032 Chronic diastolic (congestive) heart failure: Secondary | ICD-10-CM | POA: Diagnosis present

## 2024-04-19 DIAGNOSIS — R000 Tachycardia, unspecified: Secondary | ICD-10-CM | POA: Diagnosis present

## 2024-04-19 DIAGNOSIS — K625 Hemorrhage of anus and rectum: Principal | ICD-10-CM

## 2024-04-19 DIAGNOSIS — Z7952 Long term (current) use of systemic steroids: Secondary | ICD-10-CM

## 2024-04-19 LAB — COMPREHENSIVE METABOLIC PANEL WITH GFR
ALT: 40 U/L (ref 0–44)
AST: 35 U/L (ref 15–41)
Albumin: 3.7 g/dL (ref 3.5–5.0)
Alkaline Phosphatase: 96 U/L (ref 38–126)
Anion gap: 15 (ref 5–15)
BUN: 27 mg/dL — ABNORMAL HIGH (ref 8–23)
CO2: 20 mmol/L — ABNORMAL LOW (ref 22–32)
Calcium: 8.9 mg/dL (ref 8.9–10.3)
Chloride: 98 mmol/L (ref 98–111)
Creatinine, Ser: 1.7 mg/dL — ABNORMAL HIGH (ref 0.44–1.00)
GFR, Estimated: 34 mL/min — ABNORMAL LOW
Glucose, Bld: 126 mg/dL — ABNORMAL HIGH (ref 70–99)
Potassium: 3.9 mmol/L (ref 3.5–5.1)
Sodium: 132 mmol/L — ABNORMAL LOW (ref 135–145)
Total Bilirubin: 0.4 mg/dL (ref 0.0–1.2)
Total Protein: 6.7 g/dL (ref 6.5–8.1)

## 2024-04-19 LAB — LIPID PANEL
Cholesterol: 177 mg/dL (ref 0–200)
HDL: 43 mg/dL
LDL Cholesterol: 98 mg/dL (ref 0–99)
Total CHOL/HDL Ratio: 4.1 ratio
Triglycerides: 183 mg/dL — ABNORMAL HIGH
VLDL: 37 mg/dL (ref 0–40)

## 2024-04-19 LAB — ECHOCARDIOGRAM COMPLETE
Area-P 1/2: 3.72 cm2
Calc EF: 58.2 %
Height: 64 in
S' Lateral: 3 cm
Single Plane A2C EF: 56.5 %
Single Plane A4C EF: 58.7 %
Weight: 2716.07 [oz_av]

## 2024-04-19 LAB — HIV ANTIBODY (ROUTINE TESTING W REFLEX): HIV Screen 4th Generation wRfx: NONREACTIVE

## 2024-04-19 LAB — PROTIME-INR
INR: 1.1 (ref 0.8–1.2)
Prothrombin Time: 14.7 s (ref 11.4–15.2)

## 2024-04-19 LAB — CBC WITH DIFFERENTIAL/PLATELET
Abs Immature Granulocytes: 0.18 K/uL — ABNORMAL HIGH (ref 0.00–0.07)
Basophils Absolute: 0.1 K/uL (ref 0.0–0.1)
Basophils Relative: 0 %
Eosinophils Absolute: 0.1 K/uL (ref 0.0–0.5)
Eosinophils Relative: 0 %
HCT: 40 % (ref 36.0–46.0)
Hemoglobin: 12.4 g/dL (ref 12.0–15.0)
Immature Granulocytes: 1 %
Lymphocytes Relative: 8 %
Lymphs Abs: 1.3 K/uL (ref 0.7–4.0)
MCH: 22.5 pg — ABNORMAL LOW (ref 26.0–34.0)
MCHC: 31 g/dL (ref 30.0–36.0)
MCV: 72.6 fL — ABNORMAL LOW (ref 80.0–100.0)
Monocytes Absolute: 1.5 K/uL — ABNORMAL HIGH (ref 0.1–1.0)
Monocytes Relative: 10 %
Neutro Abs: 12.5 K/uL — ABNORMAL HIGH (ref 1.7–7.7)
Neutrophils Relative %: 81 %
Platelets: 566 K/uL — ABNORMAL HIGH (ref 150–400)
RBC: 5.51 MIL/uL — ABNORMAL HIGH (ref 3.87–5.11)
RDW: 17.3 % — ABNORMAL HIGH (ref 11.5–15.5)
Smear Review: NORMAL
WBC: 15.6 K/uL — ABNORMAL HIGH (ref 4.0–10.5)
nRBC: 0 % (ref 0.0–0.2)

## 2024-04-19 LAB — GASTROINTESTINAL PANEL BY PCR, STOOL (REPLACES STOOL CULTURE)

## 2024-04-19 LAB — C DIFFICILE QUICK SCREEN W PCR REFLEX
C Diff antigen: NEGATIVE
C Diff interpretation: NOT DETECTED
C Diff toxin: NEGATIVE

## 2024-04-19 LAB — HEMOGLOBIN A1C
Hgb A1c MFr Bld: 6.1 % — ABNORMAL HIGH (ref 4.8–5.6)
Mean Plasma Glucose: 128.37 mg/dL

## 2024-04-19 LAB — TROPONIN T, HIGH SENSITIVITY: Troponin T High Sensitivity: 38 ng/L — ABNORMAL HIGH (ref 0–19)

## 2024-04-19 LAB — I-STAT CG4 LACTIC ACID, ED
Lactic Acid, Venous: 2.3 mmol/L (ref 0.5–1.9)
Lactic Acid, Venous: 1.4 mmol/L (ref 0.5–1.9)

## 2024-04-19 LAB — PROCALCITONIN: Procalcitonin: 1.36 ng/mL

## 2024-04-19 LAB — MAGNESIUM: Magnesium: 1.9 mg/dL (ref 1.7–2.4)

## 2024-04-19 LAB — HEPARIN LEVEL (UNFRACTIONATED): Heparin Unfractionated: 0.63 [IU]/mL (ref 0.30–0.70)

## 2024-04-19 LAB — TSH: TSH: 0.275 u[IU]/mL — ABNORMAL LOW (ref 0.350–4.500)

## 2024-04-19 LAB — PRO BRAIN NATRIURETIC PEPTIDE: Pro Brain Natriuretic Peptide: 1855 pg/mL — ABNORMAL HIGH

## 2024-04-19 MED ORDER — DILTIAZEM HCL-DEXTROSE 125-5 MG/125ML-% IV SOLN (PREMIX)
5.0000 mg/h | INTRAVENOUS | Status: DC
  Administered 2024-04-19 (×2): 10 mg/h via INTRAVENOUS
  Filled 2024-04-19: qty 125

## 2024-04-19 MED ORDER — LACTATED RINGERS IV BOLUS (SEPSIS)
500.0000 mL | Freq: Once | INTRAVENOUS | Status: AC
  Administered 2024-04-19: 500 mL via INTRAVENOUS

## 2024-04-19 MED ORDER — PIPERACILLIN-TAZOBACTAM 3.375 G IVPB 30 MIN
3.3750 g | Freq: Once | INTRAVENOUS | Status: AC
  Administered 2024-04-19: 3.375 g via INTRAVENOUS
  Filled 2024-04-19: qty 50

## 2024-04-19 MED ORDER — IPRATROPIUM-ALBUTEROL 0.5-2.5 (3) MG/3ML IN SOLN
3.0000 mL | Freq: Four times a day (QID) | RESPIRATORY_TRACT | Status: DC | PRN
  Filled 2024-04-19: qty 3

## 2024-04-19 MED ORDER — AMIODARONE HCL IN DEXTROSE 360-4.14 MG/200ML-% IV SOLN
60.0000 mg/h | INTRAVENOUS | Status: DC
  Administered 2024-04-19 (×2): 60 mg/h via INTRAVENOUS
  Filled 2024-04-19: qty 200

## 2024-04-19 MED ORDER — MAGNESIUM OXIDE -MG SUPPLEMENT 400 (240 MG) MG PO TABS
400.0000 mg | ORAL_TABLET | Freq: Every day | ORAL | Status: DC
  Administered 2024-04-19 – 2024-04-23 (×5): 400 mg via ORAL
  Filled 2024-04-19 (×5): qty 1

## 2024-04-19 MED ORDER — LORATADINE 10 MG PO TABS
10.0000 mg | ORAL_TABLET | Freq: Every day | ORAL | Status: DC
  Administered 2024-04-19 – 2024-04-24 (×6): 10 mg via ORAL
  Filled 2024-04-19 (×6): qty 1

## 2024-04-19 MED ORDER — DILTIAZEM HCL-DEXTROSE 125-5 MG/125ML-% IV SOLN (PREMIX)
5.0000 mg/h | INTRAVENOUS | Status: DC
  Administered 2024-04-19: 5 mg/h via INTRAVENOUS
  Filled 2024-04-19: qty 125

## 2024-04-19 MED ORDER — METOCLOPRAMIDE HCL 5 MG/ML IJ SOLN
10.0000 mg | Freq: Once | INTRAMUSCULAR | Status: AC
  Administered 2024-04-19: 10 mg via INTRAVENOUS
  Filled 2024-04-19: qty 2

## 2024-04-19 MED ORDER — PIPERACILLIN-TAZOBACTAM 3.375 G IVPB
3.3750 g | Freq: Three times a day (TID) | INTRAVENOUS | Status: DC
  Administered 2024-04-19: 3.375 g via INTRAVENOUS
  Filled 2024-04-19: qty 50

## 2024-04-19 MED ORDER — PSYLLIUM 95 % PO PACK
1.0000 | PACK | Freq: Every day | ORAL | Status: DC
  Administered 2024-04-19 – 2024-04-24 (×6): 1 via ORAL
  Filled 2024-04-19 (×6): qty 1

## 2024-04-19 MED ORDER — ROSUVASTATIN CALCIUM 5 MG PO TABS: 5.0000 mg | ORAL_TABLET | Freq: Every day | ORAL | Status: DC

## 2024-04-19 MED ORDER — ROSUVASTATIN CALCIUM 5 MG PO TABS
5.0000 mg | ORAL_TABLET | ORAL | Status: DC
  Administered 2024-04-20 – 2024-04-23 (×4): 5 mg via ORAL
  Filled 2024-04-19 (×5): qty 1

## 2024-04-19 MED ORDER — AMIODARONE LOAD VIA INFUSION
150.0000 mg | Freq: Once | INTRAVENOUS | Status: AC
  Administered 2024-04-19: 150 mg via INTRAVENOUS
  Filled 2024-04-19: qty 83.34

## 2024-04-19 MED ORDER — CARVEDILOL 12.5 MG PO TABS: 37.5000 mg | ORAL_TABLET | Freq: Two times a day (BID) | ORAL | Status: DC

## 2024-04-19 MED ORDER — GERHARDT'S BUTT CREAM
TOPICAL_CREAM | CUTANEOUS | Status: DC | PRN
  Filled 2024-04-19: qty 60

## 2024-04-19 MED ORDER — POLYETHYLENE GLYCOL 3350 17 G PO PACK: 17.0000 g | PACK | Freq: Every day | ORAL | Status: DC | PRN

## 2024-04-19 MED ORDER — SENNOSIDES-DOCUSATE SODIUM 8.6-50 MG PO TABS: 1.0000 | ORAL_TABLET | Freq: Every day | ORAL | Status: DC

## 2024-04-19 MED ORDER — SODIUM CHLORIDE 0.9 % IV SOLN: INTRAVENOUS | Status: AC

## 2024-04-19 MED ORDER — PANTOPRAZOLE SODIUM 40 MG PO TBEC
40.0000 mg | DELAYED_RELEASE_TABLET | Freq: Every day | ORAL | Status: DC
  Administered 2024-04-19 – 2024-04-24 (×6): 40 mg via ORAL
  Filled 2024-04-19 (×6): qty 1

## 2024-04-19 MED ORDER — ADULT MULTIVITAMIN W/MINERALS CH
1.0000 | ORAL_TABLET | Freq: Every day | ORAL | Status: DC
  Administered 2024-04-19 – 2024-04-24 (×6): 1 via ORAL
  Filled 2024-04-19 (×6): qty 1

## 2024-04-19 MED ORDER — HEPARIN BOLUS VIA INFUSION
4000.0000 [IU] | Freq: Once | INTRAVENOUS | Status: AC
  Administered 2024-04-19: 4000 [IU] via INTRAVENOUS
  Filled 2024-04-19: qty 4000

## 2024-04-19 MED ORDER — ASPIRIN 81 MG PO CHEW
81.0000 mg | CHEWABLE_TABLET | Freq: Every day | ORAL | Status: DC
  Administered 2024-04-19 – 2024-04-20 (×2): 81 mg via ORAL
  Filled 2024-04-19 (×3): qty 1

## 2024-04-19 MED ORDER — METHYLPREDNISOLONE SODIUM SUCC 125 MG IJ SOLR
125.0000 mg | Freq: Once | INTRAMUSCULAR | Status: AC
  Administered 2024-04-19: 125 mg via INTRAVENOUS
  Filled 2024-04-19: qty 2

## 2024-04-19 MED ORDER — PREDNISONE 5 MG PO TABS
5.0000 mg | ORAL_TABLET | Freq: Every day | ORAL | Status: DC
  Administered 2024-04-20 – 2024-04-24 (×5): 5 mg via ORAL
  Filled 2024-04-19 (×5): qty 1

## 2024-04-19 MED ORDER — HEPARIN (PORCINE) 25000 UT/250ML-% IV SOLN
1000.0000 [IU]/h | INTRAVENOUS | Status: DC
  Administered 2024-04-19 – 2024-04-20 (×2): 1000 [IU]/h via INTRAVENOUS
  Filled 2024-04-19 (×2): qty 250

## 2024-04-19 MED ORDER — VENLAFAXINE HCL ER 150 MG PO CP24
150.0000 mg | ORAL_CAPSULE | Freq: Every day | ORAL | Status: DC
  Administered 2024-04-20 – 2024-04-24 (×5): 150 mg via ORAL
  Filled 2024-04-19 (×5): qty 1

## 2024-04-19 MED ORDER — SODIUM CHLORIDE 0.9 % IV SOLN
2.0000 g | INTRAVENOUS | Status: DC
  Administered 2024-04-19 – 2024-04-24 (×6): 2 g via INTRAVENOUS
  Filled 2024-04-19 (×6): qty 20

## 2024-04-19 MED ORDER — SIROLIMUS 0.5 MG PO TABS
0.5000 mg | ORAL_TABLET | Freq: Every morning | ORAL | Status: DC
  Administered 2024-04-20 – 2024-04-24 (×4): 0.5 mg via ORAL
  Filled 2024-04-19 (×5): qty 1

## 2024-04-19 MED ORDER — AMIODARONE HCL IN DEXTROSE 360-4.14 MG/200ML-% IV SOLN: 30.0000 mg/h | INTRAVENOUS | Status: DC

## 2024-04-19 NOTE — Progress Notes (Signed)
" °  Echocardiogram 2D Echocardiogram has been performed.  Vipul Cafarelli 04/19/2024, 5:58 PM "

## 2024-04-19 NOTE — ED Triage Notes (Signed)
 Pt BIBEMS for witnessed syncopal episode. Pt reports not feeling well since Saturday. EMS got pressure of 60/40, 1L LR given en route. Afib/a-flutter noted en route. 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  EMS VS CBG 140  95/60  120-160 HR

## 2024-04-19 NOTE — ED Provider Notes (Signed)
" °  Physical Exam  BP (!) 99/59   Pulse (!) 176   Temp 99.6 F (37.6 C) (Oral)   Resp 18   SpO2 95%   Physical Exam  Procedures  Procedures  ED Course / MDM    Medical Decision Making Amount and/or Complexity of Data Reviewed Labs: ordered. Radiology: ordered.  Risk Prescription drug management. Decision regarding hospitalization.   63 yo female vomiting, diarrhea, syncope hypotensive on ems arrival. HO renal tx Here pafib.  BP improved after EMS fluids. Creatinine 1.7 Discussed with [facility] and plan to stay here for tx Now in persistent a fib. 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 11:50 AM Care discussed with Dr. Lunette at [facility] from transplant team.  She states that if patient becomes floridly septic with hypotension thought to be related to her infection, CellCept  should be held but other immunosuppressants should be continued         Levander Houston, MD 04/19/24 1150  "

## 2024-04-19 NOTE — Consult Note (Signed)
 "  Cardiology Consultation   Patient ID: Erin Ward MRN: [id number]; DOB: 01/16/1961  Admit date: 04/19/2024 Date of Consult: 04/19/2024  PCP:  [name], MD   [facility] Providers Cardiologist:  Lonni Cash, MD     Patient Profile: Erin Ward is a 63 y.o. female with a hx of NICM, HTN, HLD, CKD s/p renal transplant 2013, anemia, anxiety who is being seen 04/19/2024 for the evaluation of atrial fibrillation at the request of Dr. Georgina.  History of Present Illness: Erin Ward is a 63 year old female with above medical history who is followed by Dr. Cash. Patient was diagnosed with NICM in 2011 with EF 20% at that time. She underwent renal transplant in 2013. Echocardiogram showed normal LV function on medical therapy. Later, echocardiogram in 05/2019 showed EF 65-70%, grade I DD, mild LAE, normal RV, no significant valvular disease   Patient had a GI bleed in 2023. Had a GI workup and was found to have AVMs. These were cauterized. She was last seen by cardiology in 04/2023. At that time, she was doing well.   Patient presented to the ED on 12/31 around 1 AM. She reportedly had not been feeling well since Saturday 12/27. With EMS, BP 60/40. Reported atrial fibrillation on monitor. In the ED, initial HR 128 BPM. BP 101/58. EKG showed atrial fibrillation with HR 147 BPM. Labwork significant for creatinine 1.70, K 3.9, mag 1.9, WBC 15.6, hemoglobin 12.4. Lactic acid 2.3>1.4. pro BNP 1855. hsTn 38. GI panel positive for salmonella.   Patient was admitted to the internal medicine service for treatment of salmonella enteritis/colitis, AKI. Cardiology consulted.   On interview, patient reports that she has been feeling poorly since Monday 12/29. Developed diarrhea, nausea, poor appetite, fevers, and chills. She also felt dizzy. She denies palpitations, chest pain, or shortness of breath. Prior to presenting to the ED, she stood up and believes she lost consciousness.  She is currently feeling a bit better. Has been given IV fluids and is able to eat solid foods.    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[facility], glomerulonephritis   Hypercholesteremia    Hypertension    Immunosuppression    Kidney transplant status    PMS (premenstrual syndrome)    Renal disorder    Rosacea    Sinusitis     Past Surgical History:  Procedure Laterality Date   APPENDECTOMY     ENTEROSCOPY N/A 01/09/2022   Procedure: ENTEROSCOPY;  Surgeon: Rollin Dover, MD;  Location: [facility];  Service: Gastroenterology;  Laterality: N/A;   GIVENS CAPSULE STUDY N/A 10/27/2021   Procedure: GIVENS CAPSULE STUDY;  Surgeon: Rollin Dover, MD;  Location: [facility];  Service: Gastroenterology;  Laterality: N/A;   HOT HEMOSTASIS N/A 01/09/2022   Procedure: HOT HEMOSTASIS (ARGON PLASMA COAGULATION/BICAP);  Surgeon: Rollin Dover, MD;  Location: [facility];  Service: Gastroenterology;  Laterality: N/A;   KIDNEY TRANSPLANT     UTERINE FIBROIDECTOMY  07/1998     Scheduled Meds:  amiodarone  150 mg Intravenous Once   aspirin  81 mg Oral Daily   loratadine  10 mg  Oral Daily   multivitamin with minerals  1 tablet Oral Daily   pantoprazole  40 mg Oral Daily   [START ON 04/20/2024] predniSONE   5 mg Oral Daily   psyllium  1 packet Oral Daily   [START ON 04/20/2024] rosuvastatin  5 mg Oral Q24H   [START ON 04/20/2024] Sirolimus   0.5 mg Oral q AM   [START ON 04/20/2024] venlafaxine XR  150 mg Oral Q breakfast   Continuous Infusions:  sodium chloride  75 mL/hr at 04/19/24 1805   amiodarone     Followed by   [START ON 04/20/2024] amiodarone     cefTRIAXone  (ROCEPHIN)  IV Stopped (04/19/24 1359)   heparin 1,000 Units/hr (04/19/24 1805)   PRN Meds: Gerhardt's butt cream, ipratropium-albuterol   Allergies:   Allergies[1]  Social History:   Social History   Socioeconomic History   Marital status: Married    Spouse name: Not on file   Number of children: 1   Years of education: Not on file   Highest education level: Not on file  Occupational History   Occupation: Accounting  Tobacco Use   Smoking status: Former    Current packs/day: 0.00    Types: Cigarettes    Quit date: 05/18/1990    Years since quitting: 33.9   Smokeless tobacco: Never  Vaping Use   Vaping status: Never Used  Substance and Sexual Activity   Alcohol use: No   Drug use: No   Sexual activity: Not on file  Other Topics Concern   Not on file  Social History Narrative   Not on file   Social Drivers of Health   Tobacco Use: Medium Risk (04/19/2024)   Patient History    Smoking Tobacco Use: Former    Smokeless Tobacco Use: Never    Passive Exposure: Not on Actuary Strain: Not on file  Food Insecurity: No Food Insecurity (04/19/2024)   Epic    Worried About Programme Researcher, Broadcasting/film/video in the Last Year: Never true    Ran Out of Food in the Last Year: Never true  Transportation Needs: No Transportation Needs (04/19/2024)   Epic    Lack of Transportation (Medical): No    Lack of Transportation (Non-Medical): No  Physical Activity: Not on file  Stress: Not on file  Social Connections: Moderately Isolated (04/19/2024)   Social Connection and Isolation Panel    Frequency of Communication with Friends and Family: More than three times a week    Frequency of Social Gatherings with Friends and Family: More than three times a week    Attends Religious Services: Never    Database Administrator or Organizations: No    Attends Banker Meetings: Never    Marital Status: Married  Catering Manager Violence: Not At Risk (04/19/2024)   Epic     Fear of Current or Ex-Partner: No    Emotionally Abused: No    Physically Abused: No    Sexually Abused: No  Depression (PHQ2-9): Not on file  Alcohol Screen: Not on file  Housing: Low Risk (04/19/2024)   Epic    Unable to Pay for Housing in the Last Year: No    Number of Times Moved in the Last Year: 0    Homeless in the Last Year: No  Utilities: Not At Risk (04/19/2024)   Epic    Threatened with loss of utilities: No  Health Literacy: Not on file    Family History:   Family History  Problem  Relation Age of Onset   Hypertension Mother      ROS:  Please see the history of present illness.  All other ROS reviewed and negative.     Physical Exam/Data: Vitals:   04/19/24 1343 04/19/24 1350 04/19/24 1400 04/19/24 1645  BP:  (!) 87/59 (!) 90/58 95/63  Pulse:  (!) 103 92 92  Resp:  16 16 18   Temp: 99.6 F (37.6 C)   98.6 F (37 C)  TempSrc: Oral   Oral  SpO2:  97% 96% 97%  Weight:      Height:        Intake/Output Summary (Last 24 hours) at 04/19/2024 1819 Last data filed at 04/19/2024 1805 Gross per 24 hour  Intake 767.27 ml  Output --  Net 767.27 ml      04/19/2024   10:01 AM 05/17/2023    8:08 AM 04/06/2022    8:32 AM  Last 3 Weights  Weight (lbs) 169 lb 12.1 oz 169 lb 12.8 oz 170 lb 6.4 oz  Weight (kg) 77 kg 77.021 kg 77.293 kg     Body mass index is 29.14 kg/m.  General:  Well nourished, well developed, in no acute distress. Sitting upright in the bed  HEENT: normal Neck: no JVD Vascular: Radial pulses 2+ bilaterally Cardiac:  normal S1, S2; RRR; no murmur   Lungs:  clear to auscultation bilaterally, no wheezing, rhonchi or rales. Normal WOB on room air  Abd: soft, nontender  Ext: no edema in BLE  Musculoskeletal:  No deformities  Skin: warm and dry  Neuro:  CNs 2-12 intact, no focal abnormalities noted Psych:  Normal affect     Telemetry:  Telemetry was personally reviewed and demonstrates:  In the ED, patient was in and out of afib. Currently  in NSR with HR in the 60s   Relevant CV Studies:    Laboratory Data: High Sensitivity Troponin:  No results for input(s): TROPONINIHS in the last 720 hours.  Recent Labs  Lab 04/19/24 0158  TRNPT 38*      Chemistry Recent Labs  Lab 04/19/24 0158  NA 132*  K 3.9  CL 98  CO2 20*  GLUCOSE 126*  BUN 27*  CREATININE 1.70*  CALCIUM 8.9  MG 1.9  GFRNONAA 34*  ANIONGAP 15    Recent Labs  Lab 04/19/24 0158  PROT 6.7  ALBUMIN 3.7  AST 35  ALT 40  ALKPHOS 96  BILITOT 0.4   Lipids  Recent Labs  Lab 04/19/24 1141  CHOL 177  TRIG 183*  HDL 43  LDLCALC 98  CHOLHDL 4.1    Hematology Recent Labs  Lab 04/19/24 0158  WBC 15.6*  RBC 5.51*  HGB 12.4  HCT 40.0  MCV 72.6*  MCH 22.5*  MCHC 31.0  RDW 17.3*  PLT 566*   Thyroid   Recent Labs  Lab 04/19/24 1141  TSH 0.275*    BNP Recent Labs  Lab 04/19/24 0158  PROBNP 1,855.0*    DDimer No results for input(s): DDIMER in the last 168 hours.  Radiology/Studies:  ECHOCARDIOGRAM COMPLETE Result Date: 04/19/2024    ECHOCARDIOGRAM REPORT   Patient Name:   MIKU UDALL Date of Exam: 04/19/2024 Medical Rec #:  [id number]     Height:       64.0 in Accession #:    [id number]    Weight:       169.8 lb Date of Birth:  1960-09-28     BSA:  1.825 m Patient Age:    63 years      BP:           87/59 mmHg Patient Gender: F             HR:           68 bpm. Exam Location:  Inpatient Procedure: 2D Echo (Both Spectral and Color Flow Doppler were utilized during            procedure). Indications:    Atrial fibrillation  History:        Patient has prior history of Echocardiogram examinations, most                 recent 06/01/2019. Risk Factors:Hypertension and Dyslipidemia.  Sonographer:    Tinnie Barefoot RDCS Referring Phys: [id number] ROLLO JONELLE LOUDER IMPRESSIONS  1. Left ventricular ejection fraction, by estimation, is 60 to 65%. The left ventricle has normal function. The left ventricle has no regional wall motion  abnormalities. Left ventricular diastolic parameters were normal.  2. Right ventricular systolic function is normal. The right ventricular size is normal.  3. The mitral valve is normal in structure. No evidence of mitral valve regurgitation. No evidence of mitral stenosis.  4. The aortic valve is tricuspid. Aortic valve regurgitation is not visualized. No aortic stenosis is present.  5. The inferior vena cava is normal in size with greater than 50% respiratory variability, suggesting right atrial pressure of 3 mmHg. FINDINGS  Left Ventricle: Left ventricular ejection fraction, by estimation, is 60 to 65%. The left ventricle has normal function. The left ventricle has no regional wall motion abnormalities. The left ventricular internal cavity size was normal in size. There is  no left ventricular hypertrophy. Left ventricular diastolic parameters were normal. Right Ventricle: The right ventricular size is normal. Right ventricular systolic function is normal. Left Atrium: Left atrial size was normal in size. Right Atrium: Right atrial size was normal in size. Pericardium: There is no evidence of pericardial effusion. Mitral Valve: The mitral valve is normal in structure. No evidence of mitral valve regurgitation. No evidence of mitral valve stenosis. Tricuspid Valve: The tricuspid valve is normal in structure. Tricuspid valve regurgitation is trivial. No evidence of tricuspid stenosis. Aortic Valve: The aortic valve is tricuspid. Aortic valve regurgitation is not visualized. No aortic stenosis is present. Pulmonic Valve: The pulmonic valve was normal in structure. Pulmonic valve regurgitation is not visualized. No evidence of pulmonic stenosis. Aorta: The aortic root is normal in size and structure. Venous: The inferior vena cava is normal in size with greater than 50% respiratory variability, suggesting right atrial pressure of 3 mmHg. IAS/Shunts: No atrial level shunt detected by color flow Doppler.  LEFT  VENTRICLE PLAX 2D LVIDd:         4.30 cm     Diastology LVIDs:         3.00 cm     LV e' medial:    9.90 cm/s LV PW:         1.00 cm     LV E/e' medial:  7.9 LV IVS:        0.90 cm     LV e' lateral:   10.40 cm/s LVOT diam:     1.80 cm     LV E/e' lateral: 7.6 LV SV:         47 LV SV Index:   26 LVOT Area:     2.54 cm LV IVRT:  92 msec  LV Volumes (MOD) LV vol d, MOD A2C: 67.2 ml LV vol d, MOD A4C: 65.6 ml LV vol s, MOD A2C: 29.2 ml LV vol s, MOD A4C: 27.1 ml LV SV MOD A2C:     38.0 ml LV SV MOD A4C:     65.6 ml LV SV MOD BP:      40.5 ml RIGHT VENTRICLE             IVC RV Basal diam:  2.50 cm     IVC diam: 1.90 cm RV S prime:     16.20 cm/s TAPSE (M-mode): 2.4 cm      PULMONARY VEINS                             Diastolic Velocity: 34.60 cm/s                             S/D Velocity:       1.20                             Systolic Velocity:  41.20 cm/s LEFT ATRIUM             Index        RIGHT ATRIUM           Index LA diam:        3.60 cm 1.97 cm/m   RA Area:     15.10 cm LA Vol (A2C):   54.4 ml 29.81 ml/m  RA Volume:   40.00 ml  21.92 ml/m LA Vol (A4C):   48.7 ml 26.69 ml/m LA Biplane Vol: 53.2 ml 29.16 ml/m  AORTIC VALVE LVOT Vmax:   91.70 cm/s LVOT Vmean:  59.200 cm/s LVOT VTI:    0.183 m  AORTA Ao Root diam: 2.90 cm Ao Asc diam:  3.40 cm MITRAL VALVE MV Area (PHT): 3.72 cm    SHUNTS MV Decel Time: 204 msec    Systemic VTI:  0.18 m MV E velocity: 78.60 cm/s  Systemic Diam: 1.80 cm MV A velocity: 77.60 cm/s MV E/A ratio:  1.01 Redell Shallow MD Electronically signed by Redell Shallow MD Signature Date/Time: 04/19/2024/6:04:35 PM    Final    CT CHEST ABDOMEN PELVIS WO CONTRAST Result Date: 04/19/2024 EXAM: CT CHEST, ABDOMEN AND PELVIS WITHOUT CONTRAST 04/19/2024 02:49:25 AM TECHNIQUE: CT of the chest, abdomen and pelvis was performed without the administration of intravenous contrast. Multiplanar reformatted images are provided for review. Automated exposure control, iterative reconstruction,  and/or weight based adjustment of the mA/kV was utilized to reduce the radiation dose to as low as reasonably achievable. COMPARISON: None available. CLINICAL HISTORY: abdominal pain, hypotension, kidney transplant previously FINDINGS: CHEST: MEDIASTINUM AND LYMPH NODES: Heart and pericardium are unremarkable. The central airways are clear. No mediastinal, hilar or axillary lymphadenopathy. LUNGS AND PLEURA: No focal consolidation or pulmonary edema. No pleural effusion. No pneumothorax. ABDOMEN AND PELVIS: LIVER: Unremarkable. GALLBLADDER AND BILE DUCTS: The gallbladder is distended. No biliary ductal dilatation. SPLEEN: No acute abnormality. PANCREAS: No acute abnormality. ADRENAL GLANDS: No acute abnormality. KIDNEYS, URETERS AND BLADDER: Both native kidneys are severely atrophic. Normal appearance of right lower quadrant transplant kidney. No hydronephrosis. No perinephric or periureteral stranding. Urinary bladder is unremarkable. GI AND BOWEL: Mild inflammatory change adjacent to the splenic flexure of the colon. Stomach demonstrates no acute  abnormality. There is no bowel obstruction. REPRODUCTIVE ORGANS: No acute abnormality. PERITONEUM AND RETROPERITONEUM: No ascites. No free air. VASCULATURE: Calcific aortic atherosclerosis. Aorta is normal in caliber. ABDOMINAL AND PELVIS LYMPH NODES: No lymphadenopathy. REPRODUCTIVE ORGANS: No acute abnormality. BONES AND SOFT TISSUES: No acute osseous abnormality. No focal soft tissue abnormality. IMPRESSION: 1. Mild colitis at the splenic flexure of the colon. 2. Distended gallbladder. 3. Normal appearance of the right lower quadrant transplant kidney. Electronically signed by: Franky Stanford MD 04/19/2024 03:35 AM EST RP Workstation: [id number]   CT Head Wo Contrast Result Date: 04/19/2024 EXAM: CT HEAD WITHOUT CONTRAST 04/19/2024 02:49:25 AM TECHNIQUE: CT of the head was performed without the administration of intravenous contrast. Automated exposure control,  iterative reconstruction, and/or weight based adjustment of the mA/kV was utilized to reduce the radiation dose to as low as reasonably achievable. COMPARISON: None available. CLINICAL HISTORY: Head trauma, moderate-severe FINDINGS: BRAIN AND VENTRICLES: No acute hemorrhage. No evidence of acute infarct. No hydrocephalus. No extra-axial collection. No mass effect or midline shift. ORBITS: No acute abnormality. SINUSES: No acute abnormality. SOFT TISSUES AND SKULL: No acute soft tissue abnormality. No skull fracture. IMPRESSION: 1. No acute intracranial abnormality. Electronically signed by: Franky Stanford MD 04/19/2024 03:10 AM EST RP Workstation: [id number]   DG Chest Port 1 View Result Date: 04/19/2024 EXAM: 1 VIEW XRAY OF THE CHEST 04/19/2024 01:48:00 AM COMPARISON: 07/11/2021 CLINICAL HISTORY: Questionable sepsis - evaluate for abnormality FINDINGS: LUNGS AND PLEURA: No focal pulmonary opacity. No pleural effusion. No pneumothorax. HEART AND MEDIASTINUM: No acute abnormality of the cardiac and mediastinal silhouettes. BONES AND SOFT TISSUES: No acute osseous abnormality. IMPRESSION: 1. No acute process. Electronically signed by: Franky Stanford MD 04/19/2024 02:29 AM EST RP Workstation: [id number]     Assessment and Plan:  New onset atrial fibrillation  - Patient presented with diarrhea, chills, fatigue. Found to have salmonella enteritis/colitis. Was also found to be in new onset atrial fibrillation with RVR - Patient has AKI with creatinine 1.70. Lactic acid 2.3>1.4.  K and mag WNL - TSH low at 0.275. Ordered free T4  - When in the ED, patient was in and out of afib on a dilt drip. Currently in NSR with HR in the 60s - Will transition to amiodarone from diltiazem given history of cardiomyopathy - When BP allows, start metoprolol tartrate  - Ordered echocardiogram  - Continue IV heparin with plans to transition to DOAC prior to DC   History of NICM, HFimpEF  - EF previously as low as 20% in  2011. Improved by 2012. Most recent echocardiogram from 2021 showed EF 65-70%, no wall motion abnormalities, grade I DD, normal RV systolic function - Patient is euvolemic on exam. She has been having diarrhea for the past few days. Has not been eating or drinking as much as usual. Hold diuresis  - Ordered echocardiogram with new afib as above   HTN  - OK to hold home BP medications while patient hypotensive   Otherwise per primary  - Salmonella enteritis /colitis  - AKI, ESRD s/p renal transplant  - Asthma       Risk Assessment/Risk Scores:         CHA2DS2-VASc Score = 2   This indicates a 2.2% annual risk of stroke. The patient's score is based upon: CHF History: 1 HTN History: 0 Diabetes History: 0 Stroke History: 0 Vascular Disease History: 0 Age Score: 0 Gender Score: 1         For questions or updates, please  contact [facility] Please consult [email or web address] for contact info under      Signed, Kamyra Schroeck K Deneice Wack, MD  04/19/2024 6:19 PM    ATTENDING ATTESTATION:  After conducting a review of all available clinical information with the care team, interviewing the patient, and performing a physical exam, I agree with the findings and plan described in this note with adjustments as indicated below which were discussed and enacted by staff above.   GEN: No acute distress, AO x 3 HEENT:  MMM, no JVD, no scleral icterus Cardiac: RRR, no murmurs, rubs, or gallops.  Respiratory: Clear to auscultation bilaterally. GI: Soft, nontender, non-distended  MS: No edema; No deformity. Neuro:  Nonfocal  Vasc:  +2 radial pulses  Patient is 63 year old female with a history of nonischemic cardiomyopathy with normalized LV function on goal-directed medical therapy.  She was admitted with Salmonella colitis.  Her course was complicated by acute kidney injury and the development of atrial fibrillation.  She was started on diltiazem drip and is converted to normal  sinus rhythm.  New onset atrial fibrillation Given history of cardiomyopathy with now normalized ejection fraction, I think diltiazem is not the best agent.  Change to amiodarone bolus and infusion Will transition to p.o. amiodarone tomorrow;  Consider transition to home Coreg  37.5 mg twice daily thereafter History of nonischemic cardiomyopathy EF 20% in 2011 Normalized on echocardiogram 2021 Obtain echocardiogram this admission Hypertension Blood pressure relatively soft Hold GDMT for now Optimize regimen later this admission  Lurena Red, MD Pager [phone]   ADDENDUM 04/20/04 [id number]:  Pharmacy indicates significant interaction between sirolimus  and amiodarone.  Will change back to cardizem gtt for now.    [1]  Allergies Allergen Reactions   Hydrocodone-Acetaminophen Itching   Lisinopril Cough   "

## 2024-04-19 NOTE — ED Notes (Signed)
 Patient transported to CT

## 2024-04-19 NOTE — Plan of Care (Signed)

## 2024-04-19 NOTE — Progress Notes (Signed)
 PHARMACY - ANTICOAGULATION CONSULT NOTE  Pharmacy Consult for Heparin  Indication: atrial fibrillation  Allergies[1]   Vital Signs: Temp: 99.6 F (37.6 C) (12/31 0510) Temp Source: Oral (12/31 0510) BP: 97/60 (12/31 0540) Pulse Rate: 74 (12/31 0540)  Labs: Recent Labs    04/19/24 0158  HGB 12.4  HCT 40.0  PLT 566*  LABPROT 14.7  INR 1.1  CREATININE 1.70*    CrCl cannot be calculated (Unknown ideal weight.).   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[facility], glomerulonephritis   Hypercholesteremia    Hypertension    Immunosuppression    Kidney transplant status    PMS (premenstrual syndrome)    Renal disorder    Rosacea    Sinusitis     Assessment: 63 y/o F presents to the ED with syncope, found to be in afib, starting heparin, labs above, PTA meds reviewed, hx of kidney transplant, possible [facility] transfer if bed available  Goal of Therapy:  Heparin level 0.3-0.7 units/ml Monitor platelets by anticoagulation protocol: Yes   Plan:  Heparin 4000 units BOLUS Start heparin drip at 1000 units/hr Heparin level in 8 hours Daily CBC/Heparin level Monitor for bleeding  Lynwood Mckusick, PharmD, BCPS Clinical Pharmacist Phone: [phone]      [1]  Allergies Allergen Reactions   Hydrocodone-Acetaminophen Itching   Lisinopril Cough

## 2024-04-19 NOTE — H&P (Signed)
 " History and Physical    Patient: Erin Ward FMW:[id number] DOB: [birth year] DOA: 04/19/2024 DOS: the patient was seen and examined on 04/19/2024 PCP: [name], MD  Patient coming from: Home  Chief Complaint:  Chief Complaint  Patient presents with   Loss of Consciousness   HPI: EVALYNN [name] is a 63 y.o. 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  The patient presented with diarrhea. The symptoms began on Monday with a fever. By Tuesday, the patient had no appetite and continued to feel unwell. The patient described the diarrhea as watery and occurring frequently - too numerous to count. The patient also reported experiencing shaking chills on Tuesday, severe enough to require covering up. The patient spent most of the day sleeping on Tuesday, and when her symptoms failed to improve she presented to the ED for further evaluation. Pt denies sick contacts or recent travel.  In the ED, pt tachycardic and hypotensive. Labs notable for Cr 1.7 (baseline 0.9-1.0), pro-BNP 1855, lactic acid 2.3>1.4, and WBC 15.6. CDI neg. Blood cultures pending. CXR showed no acute process. CTH w/ NAICA. EKG with new onset AFRVR.    Review of Systems: As mentioned in the history of present illness. All other systems reviewed and are negative. 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[facility], glomerulonephritis   Hypercholesteremia     Hypertension    Immunosuppression    Kidney transplant status    PMS (premenstrual syndrome)    Renal disorder    Rosacea    Sinusitis    Past Surgical History:  Procedure Laterality Date   APPENDECTOMY     ENTEROSCOPY N/A 01/09/2022   Procedure: ENTEROSCOPY;  Surgeon: Rollin Dover, MD;  Location: [facility];  Service: Gastroenterology;  Laterality: N/A;   GIVENS CAPSULE STUDY N/A 10/27/2021   Procedure: GIVENS CAPSULE STUDY;  Surgeon: Rollin Dover, MD;  Location: [facility]COPY;  Service: Gastroenterology;  Laterality: N/A;   HOT HEMOSTASIS N/A 01/09/2022   Procedure: HOT HEMOSTASIS (ARGON PLASMA COAGULATION/BICAP);  Surgeon: Rollin Dover, MD;  Location: [facility];  Service: Gastroenterology;  Laterality: N/A;   KIDNEY TRANSPLANT     UTERINE FIBROIDECTOMY  07/1998   Social History:  reports that she quit smoking about 33 years ago. Her smoking use included cigarettes. She has never used smokeless tobacco. She reports that she does not drink alcohol and does not use drugs.  Allergies[1]  Family History  Problem Relation Age of Onset   Hypertension Mother     Prior to Admission medications  Medication Sig Start Date End Date Taking? Authorizing Provider  acetaminophen (TYLENOL) 500 MG tablet Take 1,000 mg by mouth every 6 (six) hours as needed for moderate pain.   Yes [provider]  albuterol  (VENTOLIN  HFA) 108 (90 Base) MCG/ACT inhaler Inhale 2 puffs into the lungs every 6 (six) hours as needed for wheezing or shortness of breath.  Yes [provider]  aspirin 81 MG chewable tablet Chew 81 mg by mouth daily.   Yes [provider]  carvedilol  (COREG ) 25 MG tablet Take 37.5 mg by mouth 2 (two) times daily with a meal.   Yes [provider]  cetirizine (ZYRTEC) 10 MG tablet Take 10 mg by mouth daily.   Yes [provider]  diphenhydrAMINE (BENADRYL) 25 MG tablet Take 25 mg by mouth at bedtime as needed for sleep.   Yes [provider]  losartan  (COZAAR ) 100 MG tablet Take 100 mg by mouth daily.   Yes [provider]  magnesium oxide (MAG-OX) 400 (240 Mg) MG tablet Take 400 mg by mouth at bedtime.   Yes [provider]  Multiple Vitamins-Minerals (CENTRUM ADULTS PO) Take 1 tablet by mouth daily.   Yes [provider]  mycophenolate  (CELLCEPT ) 500 MG tablet Take 500 mg by mouth 2 (two) times daily. Takes at 7:45 am and pm 04/27/19  Yes [provider]  Omega-3 Fatty Acids (FISH OIL) 1000 MG CAPS Take 1,000 mg by mouth daily.   Yes [provider]  omeprazole (PRILOSEC) 20 MG capsule Take 20 mg by mouth daily.   Yes [provider]  Polyethyl Glyc-Propyl Glyc PF (SYSTANE HYDRATION PF) 0.4-0.3 % SOLN Place 1 drop into both eyes daily as needed (dry eyes).   Yes [provider]  predniSONE  (DELTASONE ) 5 MG tablet Take 5 mg by mouth daily. Prefers to take at 7:45 AM   Yes [provider]  Probiotic Product (PROBIOTIC PO) Take 1 capsule by mouth daily.   Yes [provider]  rosuvastatin (CRESTOR) 5 MG tablet Take 5 mg by mouth daily.   Yes [provider]  Sirolimus  0.5 MG TABS Take 1 tablet by mouth daily. Prefers to take at 7:45 AM   Yes [provider]  venlafaxine XR (EFFEXOR XR) 150 MG 24 hr capsule Take 150 mg by mouth daily with breakfast. Prefers to take at 7:45 AM   Yes [provider]    Physical Exam: Vitals:   04/19/24 0900 04/19/24 0915 04/19/24 0930 04/19/24 1001  BP: (!) 89/54 105/65 109/62   Pulse: 70 93 (!) 113   Resp: 15 19 13    Temp:    99.7 F (37.6 C)  TempSrc:    Oral  SpO2: 99% 100% 100%    General: Alert, oriented x3, resting comfortably in no acute distress Respiratory: Lungs clear to auscultation bilaterally with normal respiratory effort; no w/r/r Cardiovascular: Regular rate and rhythm w/o m/r/g Abdomen: Soft, nontender, nondistended. Positive bowel sounds   Data  Reviewed:  Lab Results  Component Value Date   WBC 15.6 (H) 04/19/2024   HGB 12.4 04/19/2024   HCT 40.0 04/19/2024   MCV 72.6 (L) 04/19/2024   PLT 566 (H) 04/19/2024   Lab Results  Component Value Date   GLUCOSE 126 (H) 04/19/2024   CALCIUM 8.9 04/19/2024   NA 132 (L) 04/19/2024   K 3.9 04/19/2024   CO2 20 (L) 04/19/2024   CL 98 04/19/2024   BUN 27 (H) 04/19/2024   CREATININE 1.70 (H) 04/19/2024   Lab Results  Component Value Date   ALT 40 04/19/2024   AST 35 04/19/2024   ALKPHOS 96 04/19/2024   BILITOT 0.4 04/19/2024   Lab Results  Component Value Date   INR 1.1 04/19/2024   INR 0.85 06/24/2010   INR 0.87 Nov 21, 2009   Radiology: CT CHEST ABDOMEN PELVIS WO CONTRAST  Result Date: 04/19/2024 EXAM: CT CHEST, ABDOMEN AND PELVIS WITHOUT CONTRAST 04/19/2024 02:49:25 AM TECHNIQUE: CT of the chest, abdomen and pelvis was performed without the administration of intravenous contrast. Multiplanar reformatted images are provided for review. Automated exposure control, iterative reconstruction, and/or weight based adjustment of the mA/kV was utilized to reduce the radiation dose to as low as reasonably achievable. COMPARISON: None available. CLINICAL HISTORY: abdominal pain, hypotension, kidney transplant previously FINDINGS: CHEST: MEDIASTINUM AND LYMPH NODES: Heart and pericardium are unremarkable. The central airways are clear. No mediastinal, hilar or axillary lymphadenopathy. LUNGS AND PLEURA: No focal consolidation or pulmonary edema. No pleural effusion. No pneumothorax. ABDOMEN AND PELVIS: LIVER: Unremarkable. GALLBLADDER AND BILE DUCTS: The gallbladder is distended. No biliary ductal dilatation. SPLEEN: No acute abnormality. PANCREAS: No acute abnormality. ADRENAL GLANDS: No acute abnormality. KIDNEYS, URETERS AND BLADDER: Both native kidneys are severely atrophic. Normal appearance of right lower quadrant transplant kidney. No hydronephrosis. No perinephric or periureteral stranding.  Urinary bladder is unremarkable. GI AND BOWEL: Mild inflammatory change adjacent to the splenic flexure of the colon. Stomach demonstrates no acute abnormality. There is no bowel obstruction. REPRODUCTIVE ORGANS: No acute abnormality. PERITONEUM AND RETROPERITONEUM: No ascites. No free air. VASCULATURE: Calcific aortic atherosclerosis. Aorta is normal in caliber. ABDOMINAL AND PELVIS LYMPH NODES: No lymphadenopathy. REPRODUCTIVE ORGANS: No acute abnormality. BONES AND SOFT TISSUES: No acute osseous abnormality. No focal soft tissue abnormality. IMPRESSION: 1. Mild colitis at the splenic flexure of the colon. 2. Distended gallbladder. 3. Normal appearance of the right lower quadrant transplant kidney. Electronically signed by: Franky Stanford MD 04/19/2024 03:35 AM EST RP Workstation: [id number]   CT Head Wo Contrast Result Date: 04/19/2024 EXAM: CT HEAD WITHOUT CONTRAST 04/19/2024 02:49:25 AM TECHNIQUE: CT of the head was performed without the administration of intravenous contrast. Automated exposure control, iterative reconstruction, and/or weight based adjustment of the mA/kV was utilized to reduce the radiation dose to as low as reasonably achievable. COMPARISON: None available. CLINICAL HISTORY: Head trauma, moderate-severe FINDINGS: BRAIN AND VENTRICLES: No acute hemorrhage. No evidence of acute infarct. No hydrocephalus. No extra-axial collection. No mass effect or midline shift. ORBITS: No acute abnormality. SINUSES: No acute abnormality. SOFT TISSUES AND SKULL: No acute soft tissue abnormality. No skull fracture. IMPRESSION: 1. No acute intracranial abnormality. Electronically signed by: Franky Stanford MD 04/19/2024 03:10 AM EST RP Workstation: [id number]   DG Chest Port 1 View Result Date: 04/19/2024 EXAM: 1 VIEW XRAY OF THE CHEST 04/19/2024 01:48:00 AM COMPARISON: 07/11/2021 CLINICAL HISTORY: Questionable sepsis - evaluate for abnormality FINDINGS: LUNGS AND PLEURA: No focal pulmonary opacity. No  pleural effusion. No pneumothorax. HEART AND MEDIASTINUM: No acute abnormality of the cardiac and mediastinal silhouettes. BONES AND SOFT TISSUES: No acute osseous abnormality. IMPRESSION: 1. No acute process. 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  Salmonella enteritis Colitis Hypotension -IV CTX 1g daily -MIVF: NS at 75cc/h for now -Start psyllium 1 packet daily for stool bulking  AFRVR -Cards consulted; apprec eval/res -Continue IV diltiazem gtt for now -HOLD pta Coreg  37.5mg  BID for now given hypotension  AKI ESRD s/p renal transplant -Renal consulted; apprec eval/recs -MIVF per above -Contiune pta prednisone  and sirolimus  -HOLD pta Cellcept  with hypotension  per EDP discussion with OP [facility] transplant team; resume as soon as possible once safe to do so per family request -Strict I&Os and daily weights (standing preferred) -F/u PVR to r/o post-renal obstruction -F/u BMP daily -Renally dose medications for CrCl -Avoid lovenox, NSAIDs, morphine , Fleet's phosphate enema, regular insulin, contrast; no gadolinium for MRI to avoid nephrogenic systemic fibrosis  Elevated pro-BNP c/f volume overload -Defer diuresis for now; may need gentle diuresis prior to d/c; defer to Cards and Renal  Asthma -Duonebs prn  HTN -HOLD pta Coreg  37.5mg  BID; pt with low BP and will monitor of anti-hypertensives for now  HLD -PTA Crestor 5mg  daily   Advance Care Planning:   Code Status: Full Code   Consults: Renal and Cards  Family Communication: Spouse  Severity of Illness: The appropriate patient status for this patient is INPATIENT. Inpatient status is judged to  be reasonable and necessary in order to provide the required intensity of service to ensure the patient's safety. The patient's presenting symptoms, physical exam findings, and initial radiographic and laboratory data in the context of their chronic comorbidities is felt to place them at high risk for further clinical deterioration. Furthermore, it is not anticipated that the patient will be medically stable for discharge from the hospital within 2 midnights of admission.   * I certify that at the point of admission it is my clinical judgment that the patient will require inpatient hospital care spanning beyond 2 midnights from the point of admission due to high intensity of service, high risk for further deterioration and high frequency of surveillance required.*   ------- I spent 56 minutes reviewing previous notes, at the bedside counseling/discussing the treatment plan, and performing clinical documentation.  Author: Marsha Ada, MD 04/19/2024 10:09 AM  For on call review [email or web address].      [1]  Allergies Allergen Reactions   Hydrocodone-Acetaminophen Itching   Lisinopril Cough   "

## 2024-04-19 NOTE — ED Provider Notes (Signed)
 " [place] EMERGENCY DEPARTMENT AT [facility] Provider Note   CSN: [id number] Arrival date & time: 04/19/24  9943     Patient presents with: Loss of Consciousness   Erin Ward is a 63 y.o. female.   63 year old female with a past history of end-stage renal disease status post kidney transplant 12 years ago on CellCept  and sirolimus .  Patient started having feeling unwell with subjective fever on Monday morning.  Later in that day she started having nonbloody nonbilious emesis and diarrhea.  She is taking Tylenol and Zofran  without much help.  She states that she does feel like she was able to keep her rejection medications down.  Today she was walking down the hallway and got very lightheaded and syncopized.  States she does not know if she hit her head but it does not hurt.  She does not really have any pain anywhere besides muscular pain in her stomach from vomiting.  EMS was called.  They put her on oxygen but was not hypoxic.  She was hypotensive with blood pressures of 60/40.  Fluids were given her blood pressure started to improve.  She had episodes of a flutter/fib and route.  Prior to my evaluation patient had an episode of A-fib with RVR here as well with a rate around 147.  Sound like this was brief.  Patient states she does have a distant history of cardiomyopathy prior to her kidney transplant but no known history of A-fib.  Husband states that she had an episode of blood mixed with her diarrhea earlier today   Loss of Consciousness      Prior to Admission medications  Medication Sig Start Date End Date Taking? Authorizing Provider  acetaminophen (TYLENOL) 500 MG tablet Take 1,000 mg by mouth every 6 (six) hours as needed for moderate pain.    [provider]  albuterol  (VENTOLIN  HFA) 108 (90 Base) MCG/ACT inhaler Inhale 2 puffs into the lungs every 6 (six) hours as needed for wheezing or shortness of breath.    [provider]  aspirin 81 MG  chewable tablet Chew 81 mg by mouth daily.    [provider]  carvedilol  (COREG ) 25 MG tablet Take 25 mg by mouth as directed. Take one (1) tablet by mouth (25 mg) in the am and one and one half (1.5) tablet by mouth ( 37.5 mg ) in the evening.    [provider]  cetirizine (ZYRTEC) 10 MG tablet Take 10 mg by mouth daily.    [provider]  diphenhydrAMINE (BENADRYL) 25 MG tablet Take 25 mg by mouth at bedtime as needed for sleep.    [provider]  losartan  (COZAAR ) 100 MG tablet Take 100 mg by mouth daily.    [provider]  Multiple Vitamins-Minerals (CENTRUM ADULTS PO) Take 1 tablet by mouth daily.    [provider]  mycophenolate  (CELLCEPT ) 500 MG tablet Take 500 mg by mouth 2 (two) times daily. 04/27/19   [provider]  Omega-3 Fatty Acids (FISH OIL) 1000 MG CAPS Take 1,000 mg by mouth daily.    [provider]  omeprazole (PRILOSEC) 20 MG capsule Take 20 mg by mouth daily.    [provider]  Polyethyl Glyc-Propyl Glyc PF (SYSTANE HYDRATION PF) 0.4-0.3 % SOLN Place 1 drop into both eyes daily as needed (dry eyes).    [provider]  predniSONE  (DELTASONE ) 5 MG tablet Take 5 mg by mouth daily.  [provider]  Probiotic Product (PROBIOTIC PO) Take 1 capsule by mouth daily.    [provider]  promethazine-dextromethorphan (PROMETHAZINE-DM) 6.25-15 MG/5ML syrup Take 5 mLs by mouth 4 (four) times daily as needed for cough. 05/13/23   [provider]  rosuvastatin (CRESTOR) 5 MG tablet Take 5 mg by mouth daily.    [provider]  Sirolimus  0.5 MG TABS Take 1 tablet by mouth daily.    [provider]  triamcinolone (NASACORT ALLERGY 24HR) 55 MCG/ACT AERO nasal inhaler Place 2 sprays into the nose daily as needed (allergies).    [provider]  venlafaxine XR (EFFEXOR XR) 150 MG 24 hr capsule Take 150 mg by mouth daily with breakfast.     [provider]    Allergies: Hydrocodone-acetaminophen and Lisinopril    Review of Systems  Cardiovascular:  Positive for syncope.    Updated Vital Signs BP (!) 99/59   Pulse (!) 176   Temp 99.6 F (37.6 C) (Oral)   Resp 18   SpO2 95%   Physical Exam Vitals and nursing note reviewed.  Constitutional:      Appearance: She is well-developed.  HENT:     Head: Normocephalic and atraumatic.  Cardiovascular:     Rate and Rhythm: Regular rhythm. Tachycardia present.     Comments: Intermittent atrial fibrillation with RVR. Pulmonary:     Effort: No respiratory distress.     Breath sounds: No stridor.  Abdominal:     General: There is no distension.  Musculoskeletal:     Cervical back: Normal range of motion.  Skin:    General: Skin is warm and dry.  Neurological:     General: No focal deficit present.     Mental Status: She is alert.     (all labs ordered are listed, but only abnormal results are displayed) Labs Reviewed  COMPREHENSIVE METABOLIC PANEL WITH GFR - Abnormal; Notable for the following components:      Result Value   Sodium 132 (*)    CO2 20 (*)    Glucose, Bld 126 (*)    BUN 27 (*)    Creatinine, Ser 1.70 (*)    GFR, Estimated 34 (*)    All other components within normal limits  CBC WITH DIFFERENTIAL/PLATELET - Abnormal; Notable for the following components:   WBC 15.6 (*)    RBC 5.51 (*)    MCV 72.6 (*)    MCH 22.5 (*)    RDW 17.3 (*)    Platelets 566 (*)    Neutro Abs 12.5 (*)    Monocytes Absolute 1.5 (*)    Abs Immature Granulocytes 0.18 (*)    All other components within normal limits  PRO BRAIN NATRIURETIC PEPTIDE - Abnormal; Notable for the following components:   Pro Brain Natriuretic Peptide 1,855.0 (*)    All other components within normal limits  I-STAT CG4 LACTIC ACID, ED - Abnormal; Notable for the following components:   Lactic Acid, Venous 2.3 (*)    All other components within normal limits  TROPONIN T, HIGH  SENSITIVITY - Abnormal; Notable for the following components:   Troponin T High Sensitivity 38 (*)    All other components within normal limits  CULTURE, BLOOD (ROUTINE X 2)  CULTURE, BLOOD (ROUTINE X 2)  C DIFFICILE QUICK SCREEN W PCR REFLEX    GASTROINTESTINAL PANEL BY PCR, STOOL (REPLACES STOOL CULTURE)  PROTIME-INR  MAGNESIUM  URINALYSIS, W/ REFLEX TO CULTURE (INFECTION SUSPECTED)  MYCOPHENOLIC ACID (CELLCEPT )  SIROLIMUS  LEVEL  HEPARIN LEVEL (UNFRACTIONATED)  I-STAT CG4 LACTIC ACID, ED    EKG: None  Radiology: CT CHEST ABDOMEN PELVIS WO CONTRAST Result Date: 04/19/2024 EXAM: CT CHEST, ABDOMEN AND PELVIS WITHOUT CONTRAST 04/19/2024 02:49:25 AM TECHNIQUE: CT of the chest, abdomen and pelvis was performed without the administration of intravenous contrast. Multiplanar reformatted images are provided for review. Automated exposure control, iterative reconstruction, and/or weight based adjustment of the mA/kV was utilized to reduce the radiation dose to as low as reasonably achievable. COMPARISON: None available. CLINICAL HISTORY: abdominal pain, hypotension, kidney transplant previously FINDINGS: CHEST: MEDIASTINUM AND LYMPH NODES: Heart and pericardium are unremarkable. The central airways are clear. No mediastinal, hilar or axillary lymphadenopathy. LUNGS AND PLEURA: No focal consolidation or pulmonary edema. No pleural effusion. No pneumothorax. ABDOMEN AND PELVIS: LIVER: Unremarkable. GALLBLADDER AND BILE DUCTS: The gallbladder is distended. No biliary ductal dilatation. SPLEEN: No acute abnormality. PANCREAS: No acute abnormality. ADRENAL GLANDS: No acute abnormality. KIDNEYS, URETERS AND BLADDER: Both native kidneys are severely atrophic. Normal appearance of right lower quadrant transplant kidney. No hydronephrosis. No perinephric or periureteral stranding. Urinary bladder is unremarkable. GI AND BOWEL: Mild inflammatory change adjacent to the splenic flexure of the colon. Stomach  demonstrates no acute abnormality. There is no bowel obstruction. REPRODUCTIVE ORGANS: No acute abnormality. PERITONEUM AND RETROPERITONEUM: No ascites. No free air. VASCULATURE: Calcific aortic atherosclerosis. Aorta is normal in caliber. ABDOMINAL AND PELVIS LYMPH NODES: No lymphadenopathy. REPRODUCTIVE ORGANS: No acute abnormality. BONES AND SOFT TISSUES: No acute osseous abnormality. No focal soft tissue abnormality. IMPRESSION: 1. Mild colitis at the splenic flexure of the colon. 2. Distended gallbladder. 3. Normal appearance of the right lower quadrant transplant kidney. Electronically signed by: Franky Stanford MD 04/19/2024 03:35 AM EST RP Workstation: [id number]   CT Head Wo Contrast Result Date: 04/19/2024 EXAM: CT HEAD WITHOUT CONTRAST 04/19/2024 02:49:25 AM TECHNIQUE: CT of the head was performed without the administration of intravenous contrast. Automated exposure control, iterative reconstruction, and/or weight based adjustment of the mA/kV was utilized to reduce the radiation dose to as low as reasonably achievable. COMPARISON: None available. CLINICAL HISTORY: Head trauma, moderate-severe FINDINGS: BRAIN AND VENTRICLES: No acute hemorrhage. No evidence of acute infarct. No hydrocephalus. No extra-axial collection. No mass effect or midline shift. ORBITS: No acute abnormality. SINUSES: No acute abnormality. SOFT TISSUES AND SKULL: No acute soft tissue abnormality. No skull fracture. IMPRESSION: 1. No acute intracranial abnormality. Electronically signed by: Franky Stanford MD 04/19/2024 03:10 AM EST RP Workstation: [id number]   DG Chest Port 1 View Result Date: 04/19/2024 EXAM: 1 VIEW XRAY OF THE CHEST 04/19/2024 01:48:00 AM COMPARISON: 07/11/2021 CLINICAL HISTORY: Questionable sepsis - evaluate for abnormality FINDINGS: LUNGS AND PLEURA: No focal pulmonary opacity. No pleural effusion. No pneumothorax. HEART AND MEDIASTINUM: No acute abnormality of the cardiac and mediastinal silhouettes. BONES  AND SOFT TISSUES: No acute osseous abnormality. IMPRESSION: 1. No acute process. Electronically signed by: Franky Stanford MD 04/19/2024 02:29 AM EST RP Workstation: [id number]     .Critical Care  Performed by: Lorette Mayo, MD Authorized by: Lorette Mayo, MD   Critical care provider statement:    Critical care time (minutes):  30   Critical care was necessary to treat or prevent imminent or life-threatening deterioration of the following conditions:  Shock and dehydration   Critical care was time spent personally by me on the following activities:  Development of treatment plan with patient or surrogate, discussions with consultants, evaluation of patient's response to treatment, examination of patient, ordering and  review of laboratory studies, ordering and review of radiographic studies, ordering and performing treatments and interventions, pulse oximetry, re-evaluation of patient's condition and review of old charts    Medications Ordered in the ED  diltiazem (CARDIZEM) 125 mg in dextrose 5% 125 mL (1 mg/mL) infusion (10 mg/hr Intravenous Infusion Verify 04/19/24 0500)  heparin ADULT infusion 100 units/mL (25000 units/250mL) (1,000 Units/hr Intravenous New Bag/Given 04/19/24 0443)  lactated ringers  bolus 500 mL (0 mLs Intravenous Stopped 04/19/24 0234)  methylPREDNISolone  sodium succinate (SOLU-MEDROL ) 125 mg/2 mL injection 125 mg (125 mg Intravenous Given 04/19/24 0216)  metoCLOPramide (REGLAN) injection 10 mg (10 mg Intravenous Given 04/19/24 0215)  piperacillin-tazobactam (ZOSYN) IVPB 3.375 g (0 g Intravenous Stopped 04/19/24 0352)  heparin bolus via infusion 4,000 Units (4,000 Units Intravenous Bolus from Bag 04/19/24 0443)                                    Medical Decision Making Amount and/or Complexity of Data Reviewed Labs: ordered. Radiology: ordered.  Risk Prescription drug management. Decision regarding hospitalization.   Here with hypotension related to colitis  recently causing vomiting and diarrhea for a couple days. Hypotensive and on chronic steroids so given a dose of Solu-Medrol  here. Afib rvr here that was initially intermittent and ultimately became persistent.  Patient given antibiotics for the colitis and cdiff/GIpathogen panel sent.  Also consider possible mesenteric ischemia secondary to her new onset of A-fib without anticoagulation.  Her daughter arrived and said that she did have episodes of rectal bleeding in the past related to 'ACV', (may mean AVM).  Patient was started on heparin for the A-fib and concern for mesenteric ischemia.  Secondary to her single kidney and low GFR CT scan was used  without contrast.  Patient started on diltiazem infusion for the persistent RVR.  Dilt Bolus held secondary to soft pressures.  I discussed with Mikel Manly with her renal transplant team at [facility] who stated the patient could be admitted to [facility].  If she remained hypotensive then to reach out to them for medication recommendations (would probably hold cellcept  over the siroliums) or possible transfer.  Otherwise agreed with workup so far.  Will page hospitalist for admission.  Care transferred pending hospitalist consultation.    Final diagnoses:  Rectal bleed  Colitis  Atrial fibrillation with rapid ventricular response (HCC)  AKI (acute kidney injury)  Renal transplant recipient    ED Discharge Orders     None          Lukah Goswami, Selinda, MD 04/19/24 2258  "

## 2024-04-19 NOTE — ED Notes (Signed)
 [facility] transfer line has been contacted,

## 2024-04-19 NOTE — Progress Notes (Signed)
 PHARMACY - ANTICOAGULATION CONSULT NOTE  Pharmacy Consult for Heparin  Indication: atrial fibrillation  Allergies[1]   Vital Signs: Temp: 99.7 F (37.6 C) (12/31 1001) Temp Source: Oral (12/31 1001) BP: 111/68 (12/31 1045) Pulse Rate: 96 (12/31 1045)  Labs: Recent Labs    04/19/24 0158 04/19/24 1141  HGB 12.4  --   HCT 40.0  --   PLT 566*  --   LABPROT 14.7  --   INR 1.1  --   HEPARINUNFRC  --  0.63  CREATININE 1.70*  --     Estimated Creatinine Clearance: 34.9 mL/min (A) (by C-G formula based on SCr of 1.7 mg/dL (H)).   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 year old female admitted on 04/19/2024 with concern for afib RVR. PMH significant for kidney transplant with possible transfer to [facility] if bed available. No anticoagulation prior to admission. Pharmacy consulted to dose heparin.  Heparin level 0.63, therapeutic No issues with infusion or bleeding noted.   Goal of Therapy:  Heparin level 0.3-0.7 units/ml Monitor platelets by anticoagulation protocol: Yes   Plan:  Continue heparin drip at 1000 units/hr Daily CBC/Heparin level and monitor for bleeding F/u longterm AC plans  Thank you for allowing pharmacy to participate in this patient's care.  Leonor GORMAN Bash, PharmD Emergency Medicine Clinical Pharmacist 04/19/2024,1:25 PM        [1]  Allergies Allergen Reactions    Hydrocodone-Acetaminophen Itching   Lisinopril Cough

## 2024-04-20 DIAGNOSIS — Z940 Kidney transplant status: Secondary | ICD-10-CM | POA: Diagnosis not present

## 2024-04-20 DIAGNOSIS — I4891 Unspecified atrial fibrillation: Secondary | ICD-10-CM | POA: Diagnosis not present

## 2024-04-20 DIAGNOSIS — A020 Salmonella enteritis: Secondary | ICD-10-CM | POA: Diagnosis not present

## 2024-04-20 LAB — BASIC METABOLIC PANEL WITH GFR
Anion gap: 13 (ref 5–15)
BUN: 28 mg/dL — ABNORMAL HIGH (ref 8–23)
CO2: 20 mmol/L — ABNORMAL LOW (ref 22–32)
Calcium: 8.3 mg/dL — ABNORMAL LOW (ref 8.9–10.3)
Chloride: 104 mmol/L (ref 98–111)
Creatinine, Ser: 0.97 mg/dL (ref 0.44–1.00)
GFR, Estimated: >60 mL/min
Glucose, Bld: 155 mg/dL — ABNORMAL HIGH (ref 70–99)
Potassium: 3.7 mmol/L (ref 3.5–5.1)
Sodium: 137 mmol/L (ref 135–145)

## 2024-04-20 LAB — HEPARIN LEVEL (UNFRACTIONATED)
Heparin Unfractionated: >1.1 [IU]/mL — ABNORMAL HIGH (ref 0.30–0.70)
Heparin Unfractionated: 0.58 [IU]/mL (ref 0.30–0.70)

## 2024-04-20 LAB — CBC
HCT: 29.8 % — ABNORMAL LOW (ref 36.0–46.0)
Hemoglobin: 9.4 g/dL — ABNORMAL LOW (ref 12.0–15.0)
MCH: 22.3 pg — ABNORMAL LOW (ref 26.0–34.0)
MCHC: 31.5 g/dL (ref 30.0–36.0)
MCV: 70.8 fL — ABNORMAL LOW (ref 80.0–100.0)
Platelets: 450 K/uL — ABNORMAL HIGH (ref 150–400)
RBC: 4.21 MIL/uL (ref 3.87–5.11)
RDW: 17 % — ABNORMAL HIGH (ref 11.5–15.5)
WBC: 10.3 K/uL (ref 4.0–10.5)
nRBC: 0 % (ref 0.0–0.2)

## 2024-04-20 LAB — T4, FREE: Free T4: 0.97 ng/dL (ref 0.80–2.00)

## 2024-04-20 MED ORDER — SODIUM CHLORIDE 0.9 % IV SOLN: INTRAVENOUS | Status: DC

## 2024-04-20 MED ORDER — METOPROLOL TARTRATE 25 MG PO TABS
25.0000 mg | ORAL_TABLET | Freq: Two times a day (BID) | ORAL | Status: DC
  Administered 2024-04-20 – 2024-04-21 (×3): 25 mg via ORAL
  Filled 2024-04-20 (×3): qty 1

## 2024-04-20 MED ORDER — MYCOPHENOLATE MOFETIL 250 MG PO CAPS
500.0000 mg | ORAL_CAPSULE | Freq: Two times a day (BID) | ORAL | Status: DC
  Administered 2024-04-20 – 2024-04-24 (×9): 500 mg via ORAL
  Filled 2024-04-20 (×10): qty 2

## 2024-04-20 MED ORDER — HEPARIN (PORCINE) 25000 UT/250ML-% IV SOLN
800.0000 [IU]/h | INTRAVENOUS | Status: DC
  Administered 2024-04-21: 800 [IU]/h via INTRAVENOUS
  Filled 2024-04-20: qty 250

## 2024-04-20 MED ORDER — DILTIAZEM HCL-DEXTROSE 125-5 MG/125ML-% IV SOLN (PREMIX)
5.0000 mg/h | INTRAVENOUS | Status: DC
  Administered 2024-04-20: 10 mg/h via INTRAVENOUS
  Administered 2024-04-20: 5 mg/h via INTRAVENOUS
  Administered 2024-04-21: 10 mg/h via INTRAVENOUS
  Filled 2024-04-20 (×3): qty 125

## 2024-04-20 NOTE — Progress Notes (Signed)
 Patient has consistently been in and out of NSR rhythm all night. Patient is current in atrial fibrillation at a controlled rate.

## 2024-04-20 NOTE — Progress Notes (Signed)
 "  Rounding Note   Patient Name: Erin Ward Date of Encounter: 04/20/2024  [facility] Cardiologist: Lonni Cash, MD   Subjective  Pt denies CP  No dizziness Denies palpitations   Scheduled Meds:  aspirin  81 mg Oral Daily   loratadine  10 mg Oral Daily   magnesium oxide  400 mg Oral QHS   multivitamin with minerals  1 tablet Oral Daily   pantoprazole  40 mg Oral Daily   predniSONE   5 mg Oral Daily   psyllium  1 packet Oral Daily   rosuvastatin  5 mg Oral Q24H   Sirolimus   0.5 mg Oral q AM   venlafaxine XR  150 mg Oral Q breakfast   Continuous Infusions:  sodium chloride  75 mL/hr at 04/20/24 0041   cefTRIAXone (ROCEPHIN)  IV Stopped (04/19/24 1359)   diltiazem (CARDIZEM) infusion 5 mg/hr (04/20/24 0805)   heparin 1,000 Units/hr (04/20/24 0039)   PRN Meds: Gerhardt's butt cream, ipratropium-albuterol    Vital Signs  Vitals:   04/19/24 1920 04/19/24 2322 04/20/24 0349 04/20/24 0808  BP:  119/86 (!) 149/90 116/72  Pulse:  96 (!) 116 69  Resp: 19 18 18 16   Temp: 98.6 F (37 C) 98.8 F (37.1 C) 98.3 F (36.8 C) 98 F (36.7 C)  TempSrc: Oral Oral Oral Oral  SpO2: 97% 95% 96% 96%  Weight:      Height:        Intake/Output Summary (Last 24 hours) at 04/20/2024 0828 Last data filed at 04/19/2024 1805 Gross per 24 hour  Intake 728.9 ml  Output --  Net 728.9 ml      04/19/2024   10:01 AM 05/17/2023    8:08 AM 04/06/2022    8:32 AM  Last 3 Weights  Weight (lbs) 169 lb 12.1 oz 169 lb 12.8 oz 170 lb 6.4 oz  Weight (kg) 77 kg 77.021 kg 77.293 kg      Telemetry SR and afib/fultter with RVR (in and out) - Personally Reviewed  ECG  No new  - Personally Reviewed  Physical Exam  GEN: No acute distress.   Neck: No JVD Cardiac: Irreg irreg   No murmurs  Respiratory: Clear to auscultation bilaterally. GI: Soft, nontender, non-distended  MS: No edema; No deformity. Neuro:  Nonfocal  Psych: Normal affect   Labs High Sensitivity Troponin:  No  results for input(s): TROPONINIHS in the last 720 hours.  Recent Labs  Lab 04/19/24 0158  TRNPT 38*       Chemistry Recent Labs  Lab 04/19/24 0158 04/20/24 0639  NA 132* 137  K 3.9 3.7  CL 98 104  CO2 20* 20*  GLUCOSE 126* 155*  BUN 27* 28*  CREATININE 1.70* 0.97  CALCIUM 8.9 8.3*  MG 1.9  --   PROT 6.7  --   ALBUMIN 3.7  --   AST 35  --   ALT 40  --   ALKPHOS 96  --   BILITOT 0.4  --   GFRNONAA 34* >60  ANIONGAP 15 13    Lipids  Recent Labs  Lab 04/19/24 1141  CHOL 177  TRIG 183*  HDL 43  LDLCALC 98  CHOLHDL 4.1    Hematology Recent Labs  Lab 04/19/24 0158 04/20/24 0639  WBC 15.6* 10.3  RBC 5.51* 4.21  HGB 12.4 9.4*  HCT 40.0 29.8*  MCV 72.6* 70.8*  MCH 22.5* 22.3*  MCHC 31.0 31.5  RDW 17.3* 17.0*  PLT 566* 450*   Thyroid   Recent Labs  Lab 04/19/24 1141 04/20/24 0639  TSH 0.275*  --   FREET4  --  0.97    BNP Recent Labs  Lab 04/19/24 0158  PROBNP 1,855.0*    DDimer No results for input(s): DDIMER in the last 168 hours.   Radiology  ECHOCARDIOGRAM COMPLETE Result Date: 04/19/2024    ECHOCARDIOGRAM REPORT   Patient Name:   Erin Ward Date of Exam: 04/19/2024 Medical Rec #:  [id number]     Height:       64.0 in Accession #:    [id number]    Weight:       169.8 lb Date of Birth:  Aug 03, 1960     BSA:          1.825 m Patient Age:    63 years      BP:           87/59 mmHg Patient Gender: F             HR:           68 bpm. Exam Location:  Inpatient Procedure: 2D Echo (Both Spectral and Color Flow Doppler were utilized during            procedure). Indications:    Atrial fibrillation  History:        Patient has prior history of Echocardiogram examinations, most                 recent 06/01/2019. Risk Factors:Hypertension and Dyslipidemia.  Sonographer:    Tinnie Barefoot RDCS Referring Phys: [id number] ROLLO JONELLE LOUDER IMPRESSIONS  1. Left ventricular ejection fraction, by estimation, is 60 to 65%. The left ventricle has normal function. The  left ventricle has no regional wall motion abnormalities. Left ventricular diastolic parameters were normal.  2. Right ventricular systolic function is normal. The right ventricular size is normal.  3. The mitral valve is normal in structure. No evidence of mitral valve regurgitation. No evidence of mitral stenosis.  4. The aortic valve is tricuspid. Aortic valve regurgitation is not visualized. No aortic stenosis is present.  5. The inferior vena cava is normal in size with greater than 50% respiratory variability, suggesting right atrial pressure of 3 mmHg. FINDINGS  Left Ventricle: Left ventricular ejection fraction, by estimation, is 60 to 65%. The left ventricle has normal function. The left ventricle has no regional wall motion abnormalities. The left ventricular internal cavity size was normal in size. There is  no left ventricular hypertrophy. Left ventricular diastolic parameters were normal. Right Ventricle: The right ventricular size is normal. Right ventricular systolic function is normal. Left Atrium: Left atrial size was normal in size. Right Atrium: Right atrial size was normal in size. Pericardium: There is no evidence of pericardial effusion. Mitral Valve: The mitral valve is normal in structure. No evidence of mitral valve regurgitation. No evidence of mitral valve stenosis. Tricuspid Valve: The tricuspid valve is normal in structure. Tricuspid valve regurgitation is trivial. No evidence of tricuspid stenosis. Aortic Valve: The aortic valve is tricuspid. Aortic valve regurgitation is not visualized. No aortic stenosis is present. Pulmonic Valve: The pulmonic valve was normal in structure. Pulmonic valve regurgitation is not visualized. No evidence of pulmonic stenosis. Aorta: The aortic root is normal in size and structure. Venous: The inferior vena cava is normal in size with greater than 50% respiratory variability, suggesting right atrial pressure of 3 mmHg. IAS/Shunts: No atrial level shunt  detected by color flow Doppler.  LEFT VENTRICLE PLAX 2D LVIDd:  4.30 cm     Diastology LVIDs:         3.00 cm     LV e' medial:    9.90 cm/s LV PW:         1.00 cm     LV E/e' medial:  7.9 LV IVS:        0.90 cm     LV e' lateral:   10.40 cm/s LVOT diam:     1.80 cm     LV E/e' lateral: 7.6 LV SV:         47 LV SV Index:   26 LVOT Area:     2.54 cm LV IVRT:       92 msec  LV Volumes (MOD) LV vol d, MOD A2C: 67.2 ml LV vol d, MOD A4C: 65.6 ml LV vol s, MOD A2C: 29.2 ml LV vol s, MOD A4C: 27.1 ml LV SV MOD A2C:     38.0 ml LV SV MOD A4C:     65.6 ml LV SV MOD BP:      40.5 ml RIGHT VENTRICLE             IVC RV Basal diam:  2.50 cm     IVC diam: 1.90 cm RV S prime:     16.20 cm/s TAPSE (M-mode): 2.4 cm      PULMONARY VEINS                             Diastolic Velocity: 34.60 cm/s                             S/D Velocity:       1.20                             Systolic Velocity:  41.20 cm/s LEFT ATRIUM             Index        RIGHT ATRIUM           Index LA diam:        3.60 cm 1.97 cm/m   RA Area:     15.10 cm LA Vol (A2C):   54.4 ml 29.81 ml/m  RA Volume:   40.00 ml  21.92 ml/m LA Vol (A4C):   48.7 ml 26.69 ml/m LA Biplane Vol: 53.2 ml 29.16 ml/m  AORTIC VALVE LVOT Vmax:   91.70 cm/s LVOT Vmean:  59.200 cm/s LVOT VTI:    0.183 m  AORTA Ao Root diam: 2.90 cm Ao Asc diam:  3.40 cm MITRAL VALVE MV Area (PHT): 3.72 cm    SHUNTS MV Decel Time: 204 msec    Systemic VTI:  0.18 m MV E velocity: 78.60 cm/s  Systemic Diam: 1.80 cm MV A velocity: 77.60 cm/s MV E/A ratio:  1.01 Redell Shallow MD Electronically signed by Redell Shallow MD Signature Date/Time: 04/19/2024/6:04:35 PM    Final    CT CHEST ABDOMEN PELVIS WO CONTRAST Result Date: 04/19/2024 EXAM: CT CHEST, ABDOMEN AND PELVIS WITHOUT CONTRAST 04/19/2024 02:49:25 AM TECHNIQUE: CT of the chest, abdomen and pelvis was performed without the administration of intravenous contrast. Multiplanar reformatted images are provided for review. Automated exposure  control, iterative reconstruction, and/or weight based adjustment of the mA/kV was utilized to reduce the radiation dose to as low as reasonably achievable. COMPARISON: None  available. CLINICAL HISTORY: abdominal pain, hypotension, kidney transplant previously FINDINGS: CHEST: MEDIASTINUM AND LYMPH NODES: Heart and pericardium are unremarkable. The central airways are clear. No mediastinal, hilar or axillary lymphadenopathy. LUNGS AND PLEURA: No focal consolidation or pulmonary edema. No pleural effusion. No pneumothorax. ABDOMEN AND PELVIS: LIVER: Unremarkable. GALLBLADDER AND BILE DUCTS: The gallbladder is distended. No biliary ductal dilatation. SPLEEN: No acute abnormality. PANCREAS: No acute abnormality. ADRENAL GLANDS: No acute abnormality. KIDNEYS, URETERS AND BLADDER: Both native kidneys are severely atrophic. Normal appearance of right lower quadrant transplant kidney. No hydronephrosis. No perinephric or periureteral stranding. Urinary bladder is unremarkable. GI AND BOWEL: Mild inflammatory change adjacent to the splenic flexure of the colon. Stomach demonstrates no acute abnormality. There is no bowel obstruction. REPRODUCTIVE ORGANS: No acute abnormality. PERITONEUM AND RETROPERITONEUM: No ascites. No free air. VASCULATURE: Calcific aortic atherosclerosis. Aorta is normal in caliber. ABDOMINAL AND PELVIS LYMPH NODES: No lymphadenopathy. REPRODUCTIVE ORGANS: No acute abnormality. BONES AND SOFT TISSUES: No acute osseous abnormality. No focal soft tissue abnormality. IMPRESSION: 1. Mild colitis at the splenic flexure of the colon. 2. Distended gallbladder. 3. Normal appearance of the right lower quadrant transplant kidney. Electronically signed by: Franky Stanford MD 04/19/2024 03:35 AM EST RP Workstation: [id number]   CT Head Wo Contrast Result Date: 04/19/2024 EXAM: CT HEAD WITHOUT CONTRAST 04/19/2024 02:49:25 AM TECHNIQUE: CT of the head was performed without the administration of intravenous  contrast. Automated exposure control, iterative reconstruction, and/or weight based adjustment of the mA/kV was utilized to reduce the radiation dose to as low as reasonably achievable. COMPARISON: None available. CLINICAL HISTORY: Head trauma, moderate-severe FINDINGS: BRAIN AND VENTRICLES: No acute hemorrhage. No evidence of acute infarct. No hydrocephalus. No extra-axial collection. No mass effect or midline shift. ORBITS: No acute abnormality. SINUSES: No acute abnormality. SOFT TISSUES AND SKULL: No acute soft tissue abnormality. No skull fracture. IMPRESSION: 1. No acute intracranial abnormality. Electronically signed by: Franky Stanford MD 04/19/2024 03:10 AM EST RP Workstation: [id number]   DG Chest Port 1 View Result Date: 04/19/2024 EXAM: 1 VIEW XRAY OF THE CHEST 04/19/2024 01:48:00 AM COMPARISON: 07/11/2021 CLINICAL HISTORY: Questionable sepsis - evaluate for abnormality FINDINGS: LUNGS AND PLEURA: No focal pulmonary opacity. No pleural effusion. No pneumothorax. HEART AND MEDIASTINUM: No acute abnormality of the cardiac and mediastinal silhouettes. BONES AND SOFT TISSUES: No acute osseous abnormality. IMPRESSION: 1. No acute process. Electronically signed by: Franky Stanford MD 04/19/2024 02:29 AM EST RP Workstation: [id number]    Cardiac Studies Echo 04/19/24  1. Left ventricular ejection fraction, by estimation, is 60 to 65%. The  left ventricle has normal function. The left ventricle has no regional  wall motion abnormalities. Left ventricular diastolic parameters were  normal.   2. Right ventricular systolic function is normal. The right ventricular  size is normal.   3. The mitral valve is normal in structure. No evidence of mitral valve  regurgitation. No evidence of mitral stenosis.   4. The aortic valve is tricuspid. Aortic valve regurgitation is not  visualized. No aortic stenosis is present.   5. The inferior vena cava is normal in size with greater than 50%  respiratory  variability, suggesting right atrial pressure of 3 mmHg.  1. Left ventricular ejection fraction, by estimation, is 60 to 65%. The  left ventricle has normal function. The left ventricle has no regional  wall motion abnormalities. Left ventricular diastolic parameters were  normal.   2. Right ventricular systolic function is normal. The right ventricular  size is normal.  3. The mitral valve is normal in structure. No evidence of mitral valve  regurgitation. No evidence of mitral stenosis.   4. The aortic valve is tricuspid. Aortic valve regurgitation is not  visualized. No aortic stenosis is present.   5. The inferior vena cava is normal in size with greater than 50%  respiratory variability, suggesting right atrial pressure of 3 mmHg.   Patient Profile   64 y.o. female  with a hx of NICM, HTN, HLD, CKD s/p renal transplant 2013, anemia, anxiety who is being seen 04/19/2024 for the evaluation of atrial fibrillation at the request of Dr. Georgina.   Assessment & Plan   1  Atrial fibrillation  This is a new dx for patient   Pt has been feeling poorly since 12/27  in ER on 12/31 Afib with RVR   BP 60/40  Pt in and out of SR and atrial fib/flutter with RVR    She does not sense this  On review of echo LA and RA normal in size  Normal valve function   Events may be driven by infection/dehydration but they still persist Reviewed with pharmD drug interactoins (immunosupp)   For now would continue rate control with dilt  Would add low dose metoprolol Keep on heparin for now (follow H/H) If still in / out of afib will have EP eval tomorrow for possible other antiarrhythmis  2 HFimp EF  Pt with history of  NICM  dx in 2011  LVEF 20%     Echo in 2021 LVEF 65% Echo yesterday LVEF remains normal   RVEF normal   She received IV fluids yesterday   3 Renal  s/p kidney tx  Cr 1.7 and admit   Now 0.97    Pt received almost 3 L of fluid  4  GI  N/V and diarrhea Hx of GI bleeding with AVMs Now found  to have salmonella   Episode of diarrhea this am No gross bleeding but H/H down   WIll need to be followed \\Signed , Vina Gull, MD  04/20/2024, 8:28 AM    "

## 2024-04-20 NOTE — Progress Notes (Signed)
 PHARMACY - ANTICOAGULATION CONSULT NOTE  Pharmacy Consult for heparin Indication: atrial fibrillation  Allergies[1]  Patient Measurements: Height: 5' 4 (162.6 cm) Weight: 77 kg (169 lb 12.1 oz) IBW/kg (Calculated) : 54.7 HEPARIN DW (KG): 71  Vital Signs: Temp: 98 F (36.7 C) (01/01 0808) Temp Source: Oral (01/01 0808) BP: 116/72 (01/01 0808) Pulse Rate: 69 (01/01 0808)  Labs: Recent Labs    04/19/24 0158 04/19/24 1141 04/20/24 0639 04/20/24 0835  HGB 12.4  --  9.4*  --   HCT 40.0  --  29.8*  --   PLT 566*  --  450*  --   LABPROT 14.7  --   --   --   INR 1.1  --   --   --   HEPARINUNFRC  --  0.63  --  >1.10*  CREATININE 1.70*  --  0.97  --     Estimated Creatinine Clearance: 61.2 mL/min (by C-G formula based on SCr of 0.97 mg/dL).   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 year old female admitted on 04/19/2024 with concern for afib RVR. PMH significant for kidney transplant with possible transfer to [facility] if bed available. No anticoagulation prior to admission. Pharmacy consulted to dose heparin.  Heparin level 0.63, therapeutic No issues with infusion or bleeding noted.   1/1 Update: Heparin level supratherapeutic at > 1.1 on infusion of 1,000 units/h. Confirmed with RN that heparin level was drawn appropriately, no issues with heparin infusion, and no s/sx bleeding. Hgb did drop 12.4 >  9.4. Platelets 566 > 450.   Goal of Therapy:  Heparin level 0.3-0.7 units/ml Monitor platelets by anticoagulation protocol: Yes   Plan:  Hold heparin infusion for 1h, then restart at decreased rate of 800 units/h Check heparin level 6h following restart Daily CBC/Heparin level and monitor for bleeding F/u longterm AC plans  Thank you for allowing pharmacy to participate in this patient's care.  Maurilio Patten, PharmD PGY1 Pharmacy Resident [facility] 04/20/2024 9:36 AM    [1]  Allergies Allergen Reactions   Hydrocodone-Acetaminophen Itching   Lisinopril Cough

## 2024-04-20 NOTE — Progress Notes (Signed)
 "  [facility] PROGRESS NOTE   Erin Ward FMW:[id number] DOB: 07/07/1960 DOA: 04/19/2024  PCP: [name], MD  Brief History:  64 y.o. 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  Patient was hospitalized for further management.   Consultants: Cardiology  Procedures: Echocardiogram  Subjective/Interval History: Patient denies any chest pain or shortness of breath this morning.  Diarrhea appears to be subsiding.  No nausea or vomiting.  Her daughter is at the bedside.   Assessment/Plan:  Salmonella enteritis/colitis Patient came in with diarrhea.  C. difficile is negative.  GI pathogen panel positive for Salmonella species. Patient is noted to be on ceftriaxone.  She is afebrile.  WBC is noted to be normal this morning.  Diarrhea appears to be subsiding.  Continue ceftriaxone for now.  Atrial fibrillation with RVR Appears to be a new diagnosis for her.  This is in the setting of her GI illness. TSH noted to be 0.275.  Free T4 is normal at 0.97.  Likely sick euthyroid. Patient was on amiodarone infusion which has been switched over to diltiazem infusion.  Patient is on heparin infusion. Echocardiogram shows normal LVEF.  Further management per cardiology.  Acute kidney injury/history of renal transplant Patient underwent transplant at [facility].  She is followed by local nephrology. Creatinine was 1.7 at presentation.  BUN was also elevated.  Care Everywhere was reviewed.  Creatinine was noted to be 0.97 in August. This was likely acute kidney injury due to GI loss. Patient was hydrated.  Renal function has improved this morning. She is back on her transplant medications.  She is noted to be on CellCept , sirolimus  and  prednisone . Discussed with nephrology who agrees with this plan.  Microcytic anemia Significant drop in hemoglobin noted which is likely dilutional.  Patient did see streaks of blood in her stool but no profuse bleeding has been noted. Based on Care Everywhere patient's hemoglobin was 11.5 in August. Recheck labs in the morning.  Essential hypertension Patient's carvedilol  is on hold.  Monitor blood pressures closely.   DVT Prophylaxis: On IV heparin Code Status: Full code Family Communication: Discussed with patient and her daughter Disposition Plan: Hopefully return home when improved  Status is: Inpatient Remains inpatient appropriate because: Atrial fibrillation with RVR, Salmonella enteritis    Medications: Scheduled:  aspirin  81 mg Oral Daily   loratadine  10 mg Oral Daily   magnesium oxide  400 mg Oral QHS   multivitamin with minerals  1 tablet Oral Daily   mycophenolate   500 mg Oral BID   pantoprazole  40 mg Oral Daily   predniSONE   5 mg Oral Daily   psyllium  1 packet Oral Daily   rosuvastatin  5 mg Oral Q24H   Sirolimus   0.5 mg Oral q AM   venlafaxine XR  150 mg Oral Q breakfast   Continuous:  cefTRIAXone (ROCEPHIN)  IV Stopped (04/19/24 1359)   diltiazem (CARDIZEM) infusion 5 mg/hr (04/20/24 0855)   heparin     EMW:Hzmyjmiu'd butt cream, ipratropium-albuterol   Antibiotics: Anti-infectives (From admission, onward)    Start     Dose/Rate Route Frequency Ordered Stop   04/19/24 1315  cefTRIAXone (ROCEPHIN) 2 g in sodium chloride  0.9 % 100 mL IVPB  2 g 200 mL/hr over 30 Minutes Intravenous Every 24 hours 04/19/24 1302     04/19/24 1100  piperacillin-tazobactam (ZOSYN) IVPB 3.375 g  Status:  Discontinued        3.375 g 12.5 mL/hr over 240 Minutes Intravenous Every 8 hours 04/19/24 1014 04/19/24 1302   04/19/24 0230  piperacillin-tazobactam (ZOSYN) IVPB 3.375 g        3.375 g 100 mL/hr over 30 Minutes Intravenous  Once 04/19/24 0227 04/19/24 0352        Objective:  Vital Signs  Vitals:   04/19/24 1920 04/19/24 2322 04/20/24 0349 04/20/24 0808  BP:  119/86 (!) 149/90 116/72  Pulse:  96 (!) 116 69  Resp: 19 18 18 16   Temp: 98.6 F (37 C) 98.8 F (37.1 C) 98.3 F (36.8 C) 98 F (36.7 C)  TempSrc: Oral Oral Oral Oral  SpO2: 97% 95% 96% 96%  Weight:      Height:        Intake/Output Summary (Last 24 hours) at 04/20/2024 1050 Last data filed at 04/20/2024 0855 Gross per 24 hour  Intake 2676.37 ml  Output --  Net 2676.37 ml   Filed Weights   04/19/24 1001  Weight: 77 kg    General appearance: Awake alert.  In no distress Resp: Clear to auscultation bilaterally.  Normal effort Cardio: S1-S2 is irregularly irregular GI: Abdomen is soft.  Nontender nondistended.  Bowel sounds are present normal.  No masses organomegaly Extremities: No edema.  Full range of motion of lower extremities. Neurologic: Alert and oriented x3.  No focal neurological deficits.    Lab Results:  Data Reviewed: I have personally reviewed following labs and reports of the imaging studies  CBC: Recent Labs  Lab 04/19/24 0158 04/20/24 0639  WBC 15.6* 10.3  NEUTROABS 12.5*  --   HGB 12.4 9.4*  HCT 40.0 29.8*  MCV 72.6* 70.8*  PLT 566* 450*    Basic Metabolic Panel: Recent Labs  Lab 04/19/24 0158 04/20/24 0639  NA 132* 137  K 3.9 3.7  CL 98 104  CO2 20* 20*  GLUCOSE 126* 155*  BUN 27* 28*  CREATININE 1.70* 0.97  CALCIUM 8.9 8.3*  MG 1.9  --     GFR: Estimated Creatinine Clearance: 61.2 mL/min (by C-G formula based on SCr of 0.97 mg/dL).  Liver Function Tests: Recent Labs  Lab 04/19/24 0158  AST 35  ALT 40  ALKPHOS 96  BILITOT 0.4  PROT 6.7  ALBUMIN 3.7    Coagulation Profile: Recent Labs  Lab 04/19/24 0158  INR 1.1    BNP (last 3 results) Recent Labs    04/19/24 0158  PROBNP 1,855.0*    HbA1C: Recent Labs    04/19/24 1141  HGBA1C 6.1*   Lipid Profile: Recent Labs    04/19/24 1141  CHOL 177  HDL  43  LDLCALC 98  TRIG 816*  CHOLHDL 4.1    Thyroid  Function Tests: Recent Labs    04/19/24 1141 04/20/24 0639  TSH 0.275*  --   FREET4  --  0.97    Recent Results (from the past 240 hours)  Blood Culture (routine x 2)     Status: None (Preliminary result)   Collection Time: 04/19/24  1:20 AM   Specimen: BLOOD  Result Value Ref Range Status   Specimen Description BLOOD RIGHT ANTECUBITAL  Final   Special Requests   Final    BOTTLES DRAWN AEROBIC ONLY Blood Culture adequate volume   Culture  Final    NO GROWTH 1 DAY Performed at [facility], [address], [place], [place] [address]    Report Status PENDING  Incomplete  Blood Culture (routine x 2)     Status: None (Preliminary result)   Collection Time: 04/19/24  1:25 AM   Specimen: BLOOD  Result Value Ref Range Status   Specimen Description BLOOD SITE NOT SPECIFIED  Final   Special Requests   Final    BOTTLES DRAWN AEROBIC AND ANAEROBIC Blood Culture adequate volume   Culture   Final    NO GROWTH 1 DAY Performed at [facility], [address], [place], [place] [address]    Report Status PENDING  Incomplete  C Difficile Quick Screen w PCR reflex     Status: None   Collection Time: 04/19/24  2:27 AM   Specimen: Stool  Result Value Ref Range Status   C Diff antigen NEGATIVE NEGATIVE Final   C Diff toxin NEGATIVE NEGATIVE Final   C Diff interpretation No C. difficile detected.  Final    Comment: Performed at [facility], [address] [place], [place] [address]  Gastrointestinal Panel by PCR , Stool     Status: Abnormal   Collection Time: 04/19/24  2:27 AM   Specimen: Stool  Result Value Ref Range Status   Campylobacter species NOT DETECTED NOT DETECTED Final   Plesimonas shigelloides NOT DETECTED NOT DETECTED Final   Salmonella species DETECTED (A) NOT DETECTED Final    Comment: RESULT CALLED TO, READ BACK BY AND VERIFIED WITH: MARSHA ADA MD AT 1114 04/19/2024 DLB    Yersinia  enterocolitica NOT DETECTED NOT DETECTED Final   Vibrio species NOT DETECTED NOT DETECTED Final   Vibrio cholerae NOT DETECTED NOT DETECTED Final   Enteroaggregative E coli (EAEC) NOT DETECTED NOT DETECTED Final   Enteropathogenic E coli (EPEC) NOT DETECTED NOT DETECTED Final   Enterotoxigenic E coli (ETEC) NOT DETECTED NOT DETECTED Final   Shiga like toxin producing E coli (STEC) NOT DETECTED NOT DETECTED Final   Shigella/Enteroinvasive E coli (EIEC) NOT DETECTED NOT DETECTED Final   Cryptosporidium NOT DETECTED NOT DETECTED Final   Cyclospora cayetanensis NOT DETECTED NOT DETECTED Final   Entamoeba histolytica NOT DETECTED NOT DETECTED Final   Giardia lamblia NOT DETECTED NOT DETECTED Final   Adenovirus F40/41 NOT DETECTED NOT DETECTED Final   Astrovirus NOT DETECTED NOT DETECTED Final   Norovirus GI/GII NOT DETECTED NOT DETECTED Final   Rotavirus A NOT DETECTED NOT DETECTED Final   Sapovirus (I, II, IV, and V) NOT DETECTED NOT DETECTED Final    Comment: Performed at [facility], [address], [place], [place] [address]      Radiology Studies: ECHOCARDIOGRAM COMPLETE Result Date: 04/19/2024    ECHOCARDIOGRAM REPORT   Patient Name:   Erin Ward Halter Date of Exam: 04/19/2024 Medical Rec #:  [id number]     Height:       64.0 in Accession #:    [id number]    Weight:       169.8 lb Date of Birth:  1960-10-10     BSA:          1.825 m Patient Age:    63 years      BP:           87/59 mmHg Patient Gender: F             HR:  68 bpm. Exam Location:  Inpatient Procedure: 2D Echo (Both Spectral and Color Flow Doppler were utilized during            procedure). Indications:    Atrial fibrillation  History:        Patient has prior history of Echocardiogram examinations, most                 recent 06/01/2019. Risk Factors:Hypertension and Dyslipidemia.  Sonographer:    Tinnie Barefoot RDCS Referring Phys: [id number] ROLLO JONELLE LOUDER IMPRESSIONS  1. Left ventricular ejection fraction,  by estimation, is 60 to 65%. The left ventricle has normal function. The left ventricle has no regional wall motion abnormalities. Left ventricular diastolic parameters were normal.  2. Right ventricular systolic function is normal. The right ventricular size is normal.  3. The mitral valve is normal in structure. No evidence of mitral valve regurgitation. No evidence of mitral stenosis.  4. The aortic valve is tricuspid. Aortic valve regurgitation is not visualized. No aortic stenosis is present.  5. The inferior vena cava is normal in size with greater than 50% respiratory variability, suggesting right atrial pressure of 3 mmHg. FINDINGS  Left Ventricle: Left ventricular ejection fraction, by estimation, is 60 to 65%. The left ventricle has normal function. The left ventricle has no regional wall motion abnormalities. The left ventricular internal cavity size was normal in size. There is  no left ventricular hypertrophy. Left ventricular diastolic parameters were normal. Right Ventricle: The right ventricular size is normal. Right ventricular systolic function is normal. Left Atrium: Left atrial size was normal in size. Right Atrium: Right atrial size was normal in size. Pericardium: There is no evidence of pericardial effusion. Mitral Valve: The mitral valve is normal in structure. No evidence of mitral valve regurgitation. No evidence of mitral valve stenosis. Tricuspid Valve: The tricuspid valve is normal in structure. Tricuspid valve regurgitation is trivial. No evidence of tricuspid stenosis. Aortic Valve: The aortic valve is tricuspid. Aortic valve regurgitation is not visualized. No aortic stenosis is present. Pulmonic Valve: The pulmonic valve was normal in structure. Pulmonic valve regurgitation is not visualized. No evidence of pulmonic stenosis. Aorta: The aortic root is normal in size and structure. Venous: The inferior vena cava is normal in size with greater than 50% respiratory variability,  suggesting right atrial pressure of 3 mmHg. IAS/Shunts: No atrial level shunt detected by color flow Doppler.  LEFT VENTRICLE PLAX 2D LVIDd:         4.30 cm     Diastology LVIDs:         3.00 cm     LV e' medial:    9.90 cm/s LV PW:         1.00 cm     LV E/e' medial:  7.9 LV IVS:        0.90 cm     LV e' lateral:   10.40 cm/s LVOT diam:     1.80 cm     LV E/e' lateral: 7.6 LV SV:         47 LV SV Index:   26 LVOT Area:     2.54 cm LV IVRT:       92 msec  LV Volumes (MOD) LV vol d, MOD A2C: 67.2 ml LV vol d, MOD A4C: 65.6 ml LV vol s, MOD A2C: 29.2 ml LV vol s, MOD A4C: 27.1 ml LV SV MOD A2C:     38.0 ml LV SV MOD A4C:  65.6 ml LV SV MOD BP:      40.5 ml RIGHT VENTRICLE             IVC RV Basal diam:  2.50 cm     IVC diam: 1.90 cm RV S prime:     16.20 cm/s TAPSE (M-mode): 2.4 cm      PULMONARY VEINS                             Diastolic Velocity: 34.60 cm/s                             S/D Velocity:       1.20                             Systolic Velocity:  41.20 cm/s LEFT ATRIUM             Index        RIGHT ATRIUM           Index LA diam:        3.60 cm 1.97 cm/m   RA Area:     15.10 cm LA Vol (A2C):   54.4 ml 29.81 ml/m  RA Volume:   40.00 ml  21.92 ml/m LA Vol (A4C):   48.7 ml 26.69 ml/m LA Biplane Vol: 53.2 ml 29.16 ml/m  AORTIC VALVE LVOT Vmax:   91.70 cm/s LVOT Vmean:  59.200 cm/s LVOT VTI:    0.183 m  AORTA Ao Root diam: 2.90 cm Ao Asc diam:  3.40 cm MITRAL VALVE MV Area (PHT): 3.72 cm    SHUNTS MV Decel Time: 204 msec    Systemic VTI:  0.18 m MV E velocity: 78.60 cm/s  Systemic Diam: 1.80 cm MV A velocity: 77.60 cm/s MV E/A ratio:  1.01 Redell Shallow MD Electronically signed by Redell Shallow MD Signature Date/Time: 04/19/2024/6:04:35 PM    Final    CT CHEST ABDOMEN PELVIS WO CONTRAST Result Date: 04/19/2024 EXAM: CT CHEST, ABDOMEN AND PELVIS WITHOUT CONTRAST 04/19/2024 02:49:25 AM TECHNIQUE: CT of the chest, abdomen and pelvis was performed without the administration of intravenous  contrast. Multiplanar reformatted images are provided for review. Automated exposure control, iterative reconstruction, and/or weight based adjustment of the mA/kV was utilized to reduce the radiation dose to as low as reasonably achievable. COMPARISON: None available. CLINICAL HISTORY: abdominal pain, hypotension, kidney transplant previously FINDINGS: CHEST: MEDIASTINUM AND LYMPH NODES: Heart and pericardium are unremarkable. The central airways are clear. No mediastinal, hilar or axillary lymphadenopathy. LUNGS AND PLEURA: No focal consolidation or pulmonary edema. No pleural effusion. No pneumothorax. ABDOMEN AND PELVIS: LIVER: Unremarkable. GALLBLADDER AND BILE DUCTS: The gallbladder is distended. No biliary ductal dilatation. SPLEEN: No acute abnormality. PANCREAS: No acute abnormality. ADRENAL GLANDS: No acute abnormality. KIDNEYS, URETERS AND BLADDER: Both native kidneys are severely atrophic. Normal appearance of right lower quadrant transplant kidney. No hydronephrosis. No perinephric or periureteral stranding. Urinary bladder is unremarkable. GI AND BOWEL: Mild inflammatory change adjacent to the splenic flexure of the colon. Stomach demonstrates no acute abnormality. There is no bowel obstruction. REPRODUCTIVE ORGANS: No acute abnormality. PERITONEUM AND RETROPERITONEUM: No ascites. No free air. VASCULATURE: Calcific aortic atherosclerosis. Aorta is normal in caliber. ABDOMINAL AND PELVIS LYMPH NODES: No lymphadenopathy. REPRODUCTIVE ORGANS: No acute abnormality. BONES AND SOFT TISSUES: No acute osseous abnormality. No focal soft tissue  abnormality. IMPRESSION: 1. Mild colitis at the splenic flexure of the colon. 2. Distended gallbladder. 3. Normal appearance of the right lower quadrant transplant kidney. Electronically signed by: Franky Stanford MD 04/19/2024 03:35 AM EST RP Workstation: [id number]   CT Head Wo Contrast Result Date: 04/19/2024 EXAM: CT HEAD WITHOUT CONTRAST 04/19/2024 02:49:25 AM  TECHNIQUE: CT of the head was performed without the administration of intravenous contrast. Automated exposure control, iterative reconstruction, and/or weight based adjustment of the mA/kV was utilized to reduce the radiation dose to as low as reasonably achievable. COMPARISON: None available. CLINICAL HISTORY: Head trauma, moderate-severe FINDINGS: BRAIN AND VENTRICLES: No acute hemorrhage. No evidence of acute infarct. No hydrocephalus. No extra-axial collection. No mass effect or midline shift. ORBITS: No acute abnormality. SINUSES: No acute abnormality. SOFT TISSUES AND SKULL: No acute soft tissue abnormality. No skull fracture. IMPRESSION: 1. No acute intracranial abnormality. Electronically signed by: Franky Stanford MD 04/19/2024 03:10 AM EST RP Workstation: [id number]   DG Chest Port 1 View Result Date: 04/19/2024 EXAM: 1 VIEW XRAY OF THE CHEST 04/19/2024 01:48:00 AM COMPARISON: 07/11/2021 CLINICAL HISTORY: Questionable sepsis - evaluate for abnormality FINDINGS: LUNGS AND PLEURA: No focal pulmonary opacity. No pleural effusion. No pneumothorax. HEART AND MEDIASTINUM: No acute abnormality of the cardiac and mediastinal silhouettes. BONES AND SOFT TISSUES: No acute osseous abnormality. IMPRESSION: 1. No acute process. Electronically signed by: Franky Stanford MD 04/19/2024 02:29 AM EST RP Workstation: [id number]       LOS: 1 day   Joette Pebbles  [facility] Pager on [email or web address]  04/20/2024, 10:50 AM   "

## 2024-04-20 NOTE — Progress Notes (Signed)
 PHARMACY - ANTICOAGULATION CONSULT NOTE  Pharmacy Consult for heparin Indication: atrial fibrillation  Allergies[1]  Patient Measurements: Height: 5' 4 (162.6 cm) Weight: 77 kg (169 lb 12.1 oz) IBW/kg (Calculated) : 54.7 HEPARIN DW (KG): 71  Vital Signs: Temp: 97.9 F (36.6 C) (01/01 1546) Temp Source: Oral (01/01 1546) BP: 120/78 (01/01 1546) Pulse Rate: 65 (01/01 1546)  Labs: Recent Labs    04/19/24 0158 04/19/24 1141 04/20/24 0639 04/20/24 0835 04/20/24 1709  HGB 12.4  --  9.4*  --   --   HCT 40.0  --  29.8*  --   --   PLT 566*  --  450*  --   --   LABPROT 14.7  --   --   --   --   INR 1.1  --   --   --   --   HEPARINUNFRC  --  0.63  --  >1.10* 0.58  CREATININE 1.70*  --  0.97  --   --     Estimated Creatinine Clearance: 61.2 mL/min (by C-G formula based on SCr of 0.97 mg/dL).   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 year old female admitted on 04/19/2024 with concern for afib RVR. PMH significant for kidney transplant with possible transfer to [facility] if bed available. No anticoagulation prior to admission. Pharmacy consulted to dose heparin.  Heparin level 0.63, therapeutic No issues with infusion or bleeding noted.   1/1 Update: Heparin level supratherapeutic at > 1.1 on infusion of 1,000 units/h. Confirmed with RN that heparin level was drawn appropriately, no issues with heparin  infusion, and no s/sx bleeding. Hgb did drop 12.4 > 9.4. Platelets 566 > 450.   PM update: heparin level 0.58 on heparin 800 units/hr. No issues with the infusion or bleeding reported per RN.  Goal of Therapy:  Heparin level 0.3-0.7 units/ml Monitor platelets by anticoagulation protocol: Yes   Plan:  Continue heparin drip at 800 units/h Check confirmatory heparin level in 6h  Daily CBC/Heparin level and monitor for bleeding F/u longterm AC plans  Thank you for allowing pharmacy to participate in this patient's care.  Rocky Slade, PharmD, BCPS 04/20/2024 9:36 AM  Please check AMION for all [facility] Pharmacy phone numbers After 10:00 PM, call Main Pharmacy [phone]      [1]  Allergies Allergen Reactions   Hydrocodone-Acetaminophen Itching   Lisinopril Cough

## 2024-04-21 DIAGNOSIS — I4891 Unspecified atrial fibrillation: Secondary | ICD-10-CM | POA: Diagnosis not present

## 2024-04-21 DIAGNOSIS — Z940 Kidney transplant status: Secondary | ICD-10-CM | POA: Diagnosis not present

## 2024-04-21 DIAGNOSIS — A020 Salmonella enteritis: Secondary | ICD-10-CM | POA: Diagnosis not present

## 2024-04-21 LAB — BASIC METABOLIC PANEL WITH GFR
Anion gap: 12 (ref 5–15)
BUN: 26 mg/dL — ABNORMAL HIGH (ref 8–23)
CO2: 21 mmol/L — ABNORMAL LOW (ref 22–32)
Calcium: 8.4 mg/dL — ABNORMAL LOW (ref 8.9–10.3)
Chloride: 104 mmol/L (ref 98–111)
Creatinine, Ser: 0.89 mg/dL (ref 0.44–1.00)
GFR, Estimated: >60 mL/min
Glucose, Bld: 158 mg/dL — ABNORMAL HIGH (ref 70–99)
Potassium: 3.9 mmol/L (ref 3.5–5.1)
Sodium: 137 mmol/L (ref 135–145)

## 2024-04-21 LAB — HEPARIN LEVEL (UNFRACTIONATED): Heparin Unfractionated: 0.47 [IU]/mL (ref 0.30–0.70)

## 2024-04-21 LAB — FERRITIN: Ferritin: 53 ng/mL (ref 11–307)

## 2024-04-21 LAB — CBC
HCT: 29.8 % — ABNORMAL LOW (ref 36.0–46.0)
Hemoglobin: 9.4 g/dL — ABNORMAL LOW (ref 12.0–15.0)
MCH: 22.6 pg — ABNORMAL LOW (ref 26.0–34.0)
MCHC: 31.5 g/dL (ref 30.0–36.0)
MCV: 71.6 fL — ABNORMAL LOW (ref 80.0–100.0)
Platelets: 480 K/uL — ABNORMAL HIGH (ref 150–400)
RBC: 4.16 MIL/uL (ref 3.87–5.11)
RDW: 17.1 % — ABNORMAL HIGH (ref 11.5–15.5)
WBC: 9 K/uL (ref 4.0–10.5)
nRBC: 0.4 % — ABNORMAL HIGH (ref 0.0–0.2)

## 2024-04-21 LAB — IRON AND TIBC
Iron: 29 ug/dL (ref 28–170)
Saturation Ratios: 10 % — ABNORMAL LOW (ref 10.4–31.8)
TIBC: 276 ug/dL (ref 250–450)
UIBC: 247 ug/dL

## 2024-04-21 LAB — HEMOGLOBIN AND HEMATOCRIT, BLOOD
HCT: 30.6 % — ABNORMAL LOW (ref 36.0–46.0)
Hemoglobin: 9.5 g/dL — ABNORMAL LOW (ref 12.0–15.0)

## 2024-04-21 LAB — SIROLIMUS LEVEL: Sirolimus (Rapamycin): 8.4 ng/mL (ref 3.0–20.0)

## 2024-04-21 LAB — RETICULOCYTES
Immature Retic Fract: 16.1 % — ABNORMAL HIGH (ref 2.3–15.9)
RBC.: 4.23 MIL/uL (ref 3.87–5.11)
Retic Count, Absolute: 63.9 K/uL (ref 19.0–186.0)
Retic Ct Pct: 1.5 % (ref 0.4–3.1)

## 2024-04-21 LAB — FOLATE: Folate: >20 ng/mL

## 2024-04-21 LAB — VITAMIN B12: Vitamin B-12: 947 pg/mL — ABNORMAL HIGH (ref 180–914)

## 2024-04-21 MED ORDER — LOSARTAN POTASSIUM 25 MG PO TABS
25.0000 mg | ORAL_TABLET | Freq: Every day | ORAL | Status: DC
  Administered 2024-04-21: 25 mg via ORAL
  Filled 2024-04-21: qty 1

## 2024-04-21 MED ORDER — ACETAMINOPHEN 325 MG PO TABS
650.0000 mg | ORAL_TABLET | Freq: Four times a day (QID) | ORAL | Status: DC | PRN
  Administered 2024-04-21 – 2024-04-22 (×4): 650 mg via ORAL
  Filled 2024-04-21 (×3): qty 2

## 2024-04-21 MED ORDER — METOPROLOL TARTRATE 25 MG PO TABS
25.0000 mg | ORAL_TABLET | Freq: Four times a day (QID) | ORAL | Status: DC
  Administered 2024-04-21: 25 mg via ORAL
  Filled 2024-04-21: qty 1

## 2024-04-21 MED ORDER — METOPROLOL TARTRATE 50 MG PO TABS
75.0000 mg | ORAL_TABLET | Freq: Two times a day (BID) | ORAL | Status: DC
  Administered 2024-04-21 – 2024-04-22 (×2): 75 mg via ORAL
  Filled 2024-04-21 (×2): qty 1

## 2024-04-21 MED ORDER — POTASSIUM CHLORIDE CRYS ER 20 MEQ PO TBCR
40.0000 meq | EXTENDED_RELEASE_TABLET | Freq: Once | ORAL | Status: AC
  Administered 2024-04-21: 40 meq via ORAL
  Filled 2024-04-21: qty 2

## 2024-04-21 MED ORDER — METOPROLOL TARTRATE 5 MG/5ML IV SOLN
2.5000 mg | INTRAVENOUS | Status: AC
  Administered 2024-04-21: 2.5 mg via INTRAVENOUS
  Filled 2024-04-21: qty 5

## 2024-04-21 MED ORDER — METOPROLOL TARTRATE 50 MG PO TABS: 75.0000 mg | ORAL_TABLET | Freq: Two times a day (BID) | ORAL | Status: DC

## 2024-04-21 MED ORDER — APIXABAN 5 MG PO TABS
5.0000 mg | ORAL_TABLET | Freq: Two times a day (BID) | ORAL | Status: DC
  Administered 2024-04-21 – 2024-04-24 (×6): 5 mg via ORAL
  Filled 2024-04-21 (×6): qty 1

## 2024-04-21 NOTE — Progress Notes (Signed)
 PHARMACY - ANTICOAGULATION CONSULT NOTE  Pharmacy Consult for heparin Indication: atrial fibrillation  Allergies[1]  Patient Measurements: Height: 5' 4 (162.6 cm) Weight: 77 kg (169 lb 12.1 oz) IBW/kg (Calculated) : 54.7 HEPARIN DW (KG): 71  Vital Signs: Temp: 98.9 F (37.2 C) (01/02 0029) Temp Source: Oral (01/02 0029) BP: 134/75 (01/02 0029) Pulse Rate: 63 (01/02 0029)  Labs: Recent Labs    04/19/24 0158 04/19/24 1141 04/20/24 0639 04/20/24 0835 04/20/24 1709 04/21/24 0006  HGB 12.4  --  9.4*  --   --  9.4*  HCT 40.0  --  29.8*  --   --  29.8*  PLT 566*  --  450*  --   --  480*  LABPROT 14.7  --   --   --   --   --   INR 1.1  --   --   --   --   --   HEPARINUNFRC  --    < >  --  >1.10* 0.58 0.47  CREATININE 1.70*  --  0.97  --   --   --    < > = values in this interval not displayed.    Estimated Creatinine Clearance: 61.2 mL/min (by C-G formula based on SCr of 0.97 mg/dL).   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 year old female admitted on 04/19/2024 with concern for afib RVR. PMH significant for kidney transplant with possible transfer to [facility] if bed available. No anticoagulation prior to admission. Pharmacy consulted to dose heparin.  Heparin level 0.47, therapeutic No issues with infusion or bleeding noted.   Goal of Therapy:  Heparin level 0.3-0.7 units/ml Monitor platelets  by anticoagulation protocol: Yes   Plan:  Continue heparin drip at 800 units/h Daily CBC/Heparin level and monitor for bleeding F/u longterm AC plans  Thank you for allowing pharmacy to participate in this patient's care.  Harlene Barlow, Berdine JONETTA [employer]P, BCCP Clinical Pharmacist  04/21/2024 12:33 AM   [facility] pharmacy phone numbers are listed on [email or web address]        [1]  Allergies Allergen Reactions   Hydrocodone-Acetaminophen Itching   Lisinopril Cough

## 2024-04-21 NOTE — Progress Notes (Addendum)
 "  Progress Note  Patient Name: Erin Ward Date of Encounter: 04/21/2024 [facility] Cardiologist: Lonni Cash, MD   Interval Summary   Emotional about being in the hospital, feeling very tired as she has not slept well.  Has had chest heaviness, though denied palpitations/shortness of breath.  Vital Signs Vitals:   04/20/24 1546 04/20/24 2029 04/21/24 0029 04/21/24 0537  BP: 120/78 131/77 134/75 (!) 156/93  Pulse: 65 74 63 89  Resp: 16 17 18 18   Temp: 97.9 F (36.6 C) 98.8 F (37.1 C) 98.9 F (37.2 C) 98.5 F (36.9 C)  TempSrc: Oral Oral Oral Oral  SpO2: 99% 97% 98% 97%  Weight:      Height:        Intake/Output Summary (Last 24 hours) at 04/21/2024 0737 Last data filed at 04/20/2024 2045 Gross per 24 hour  Intake 2799.25 ml  Output --  Net 2799.25 ml      04/19/2024   10:01 AM 05/17/2023    8:08 AM 04/06/2022    8:32 AM  Last 3 Weights  Weight (lbs) 169 lb 12.1 oz 169 lb 12.8 oz 170 lb 6.4 oz  Weight (kg) 77 kg 77.021 kg 77.293 kg      Telemetry/ECG  Intermittent episodes of sinus rhythm with AF, HR mostly rate controlled, though at times RVR - Personally Reviewed  Physical Exam  GEN: No acute distress.   Neck: No JVD Cardiac: irregularly, irregular, no murmurs, rubs, or gallops.  Respiratory: Clear to auscultation bilaterally. GI: Soft, nontender, non-distended  MS: No edema  Assessment & Plan  Erin Ward is a 64 y.o. female with a hx of NICM, HTN, HLD, CKD s/p renal transplant 2013, hx of GI bleed, anemia, and anxiety who presented to the ED after a witnessed syncopal episode. EMS was called, patient was noted to be hypotensive [ 60/40] and in AF. She was admitted with salmonella enteritis.  Atrial Fibrillation, with episode of RVR Presented in atrial fib with RVR  ? If driven by infection No prior history    Note LA and RA are normal in size  Pt is still in and out of afib  She has not sensed palpitations even when HR high  May have  been in and out prior to admit for awhile.  Her CHADS VASc score is 3 (female, HTN, Coronary calcifications in mid LAD and RCA) Wll ask EP to see for possible antiarrhythmic therapy  With antirejection meds amio and multaq not good   Multaq not good with hx of CHF    ? 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   Echo in 2021 normal Echo yesterday LVEF and RVEF normal   On exam appears euvolemic  PTA medications were coreg  & losartan  have been on hold with hypotension and  Would hold for now   Get HR control Will check BNP  Renal PT is s/p renal tx    Cr was 1.7 on admit  Improved with hydration  IT is 0.89 today   Hypertension Initially admitted with hypotension  Improved after hydration  BP: 139/81 I would advance metoprolol for rate control and BP   On IV dilt as well  Medications as above  Hyperlipidemia LDL 98  HDL 43   Continue crestor 5 mg   Would discontinue ASA 81 as she will be on chronic anticoagulation. Unclear why she is on ASA.   Per primary Salmonella enteritis AKI, hx of renal transplant on immunosuppression Anemia Anxiety    For questions or updates, please contact [facility] Please consult [email or web address] for contact info under       Signed, Leontine LOISE Salen, PA-C   PT seen and examined   I have amended note above by L LO[place]wood to reflect my findings  Pt did not sleep well  Did report some chest heaviness last night    Says too that she is anxious  On exam, JVP is normal Lungs   Relatively CTA Cardiac   Irreg irreg  No S3  No significant murmurs Abd   Supple  Ext without edema  -PAF  Pt in and out of afib    As noted above will continue dilt, increase metoprolol to 25 tid      On heparin now but will need to switch to Eliquis  Hb is low 9.4  Will need to be followed closely   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  Given immunosuppressive, consider for best antiarrhythmic if needed  Hx HFrEF   LVEF is normal She complains of some SOB but overall volume appears OK      Could say it is due to Afib though she did not complain of this yesterday when in and out of afib   Will check BNP   Hold ARB given titration of metoprolol   Follow BP    Vina Gull MD     "

## 2024-04-21 NOTE — TOC Initial Note (Addendum)
 Transition of Care [facility]) - Initial/Assessment Note    Patient Details  Name: Erin Ward MRN: [id number] Date of Birth: [birth year]  [facility]) CM/SW Contact:    Sudie Erminio Deems, RN Phone Number: 04/21/2024, 11:21 AM  Clinical Narrative: Patient presented for fatigue and syncope-found to be in AFIB -new onset-hx of ESRD-s/p kidney transplant followed at [facility]. PTA patient states she is independent from home with spouse. Spouse was at the bedside during the visit. Patient has PCP and she has no issues with transportation to appointments. No home needs identified during the visit. ICM will continue to follow for additional needs.                   Expected Discharge Plan: Home/Self Care Barriers to Discharge: No Barriers Identified   Patient Goals and CMS Choice Patient states their goals for this hospitalization and ongoing recovery are:: plan to return home with spouse.          Expected Discharge Plan and Services In-house Referral: NA Discharge Planning Services: CM Consult Post Acute Care Choice: NA Living arrangements for the past 2 months: Single Family Home                   DME Agency: NA       HH Arranged: NA          Prior Living Arrangements/Services Living arrangements for the past 2 months: Single Family Home Lives with:: Spouse Patient language and need for interpreter reviewed:: Yes        Need for Family Participation in Patient Care: No (Comment) Care giver support system in place?: No (comment)   Criminal Activity/Legal Involvement Pertinent to Current Situation/Hospitalization: No - Comment as needed  Activities of Daily Living   ADL Screening (condition at time of admission) Independently performs ADLs?: Yes (appropriate for developmental age) Is the patient deaf or have difficulty hearing?: No Does the patient have difficulty seeing, even when wearing glasses/contacts?: No Does the patient have difficulty  concentrating, remembering, or making decisions?: No  Permission Sought/Granted Permission sought to share information with : Case Manager, Family Supports                Emotional Assessment Appearance:: Appears stated age Attitude/Demeanor/Rapport: Engaged Affect (typically observed): Appropriate Orientation: : Oriented to Self, Oriented to Place, Oriented to  Time, Oriented to Situation Alcohol / Substance Use: Not Applicable Psych Involvement: No (comment)  Admission diagnosis:  Colitis [K52.9] Rectal bleed [K62.5] Atrial fibrillation with rapid ventricular response (HCC) [I48.91] AKI (acute kidney injury) [N17.9] Renal transplant recipient [Z94.0] Patient Active Problem List   Diagnosis Date Noted   Atrial fibrillation with rapid ventricular response (HCC) 04/19/2024   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   Hyperparathyroidism 03/11/2015   BP (high blood pressure) 03/11/2015   History of renal transplant 04/14/2013   Congestive dilated cardiomyopathy (HCC) 04/14/2013   Essential hypertension 04/14/2013   Nonischemic congestive cardiomyopathy (HCC) 11/17/2011   RAD (reactive airway disease) 11/17/2011   Sustained ventricular tachycardia (HCC) 11/17/2011   H/O kidney transplant 06/29/2011   PCP:  [name], MD Pharmacy:   [id number] - [address], [place] - [address] AT [facility] [address] [place] [address] Phone: [phone] Fax: [phone]  [id number] - [place], [place] - [address] [place] [address] Phone: [phone] Fax: [phone]     Social Drivers of Health (SDOH) Social History: SDOH Screenings  Food Insecurity: No Food Insecurity (04/19/2024)  Housing: Low Risk (04/19/2024)  Transportation Needs: No Transportation Needs (04/19/2024)  Utilities: Not At Risk (04/19/2024)  Social Connections: Moderately Isolated (04/19/2024)  Tobacco Use: Medium  Risk (04/19/2024)   SDOH Interventions:     Readmission Risk Interventions     No data to display

## 2024-04-21 NOTE — Progress Notes (Addendum)
"  ° °  EP has seen patient  Will plan for rate control   I will increase metoprolol to 75 bid (from 25 qid) Stop IV diltiazem  Follow BP and HR     Continue Eliquis  Pt will be scheduled for follow up with EP in clinic    Signed, Vina Gull, MD  04/21/2024, 5:54 PM    "

## 2024-04-21 NOTE — Consult Note (Addendum)
 "  Cardiology Consultation   Patient ID: Erin Ward MRN: [id number]; DOB: Aug 16, 1960  Admit date: 04/19/2024 Date of Consult: 04/21/2024  PCP:  Erin Kins, MD   [facility] Providers Cardiologist:  Lonni Cash, MD     Patient Profile: Erin Ward is a 64 y.o. female with a hx of  HTN, HLD, CKD (hx of renal transplant 2013) Anemia, anxiety GIB (AVMs, cauterized 2023) NICM (2011 > recovered since 2012)   who is being seen 04/21/2024 for the evaluation of new Afib at the request of Dr. Okey, at the urging of the patient's daughter.  History of Present Illness: Erin Ward follows with her cardiology team, Dr. Aretta, last saw APP 05/17/23, reported recent URI/cough syndrome, mentioned episodes of heart racing > 110's EKG this day was SR, planned for further evaluatio if symptoms persist beyond time/period of illness  She was admitted 04/19/24 w/diarrhea/febrile illness found to have colitis, + for salmonella She was hypotensive and tachycardic and noted to be in Afib w/RVR, lactic acid 2.3>1.4, and WBC 15.6  AKI Creat 1.70 Home coreg  held for low BP (SBP 80's-90's), though did get started on dilt gtt, heparin gtt,  and cardiology consulted, BNP 1855. hsTn 38  Changed her dilt gtt to amio (2/2 hx of NICM) Not felt to be volume OL  Yesterday noted paroxysmal AFib/flutter with periods of sinus rhythm LVEF preserved 60-65%, no VHD, RV OK, LA measuring 36mm Noted no particular patient awareness of rhythm changes, suspected AFib triggered by acute illness  H/H 12/40 > 9.4/29.8 > 9.4/29.8 again today  Given her anti rejection medications > amio not felt a good strategy 2/2 possible interactions (stopped 04/20/24) With hx of NICM uncertain about Flecainide or multaq EP is asked to weigh in at both the daughter and cards team  BP better, when in AFib, rates generally better as well On currently: Metoprool tart 25mg  from BID > Q6 today Dilt gtt  (04/20/24)  Heparin gtt for a/c  LABS K+ 3.9 > > 3.9 Mag 1.9 LTs ok BUN/Creat 27/1.70 >> 0.89 BNP 1855 HS Trop T 38 Lactic acid 2.3 > 1.4 WBC 15.6 > 10.3 > 9.0 H/H 12.4/40 (baseline in the 11s) > 9.4 > 9.4 Plts 566 . 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[facility], glomerulonephritis   Hypercholesteremia    Hypertension    Immunosuppression    Kidney transplant status    PMS (premenstrual syndrome)    Renal disorder    Rosacea    Sinusitis     Past Surgical History:  Procedure Laterality Date   APPENDECTOMY     ENTEROSCOPY N/A 01/09/2022   Procedure: ENTEROSCOPY;  Surgeon: Rollin Dover, MD;  Location: [facility];  Service: Gastroenterology;  Laterality: N/A;   GIVENS CAPSULE STUDY N/A 10/27/2021   Procedure: GIVENS CAPSULE STUDY;  Surgeon: Rollin Dover, MD;  Location: [facility];  Service: Gastroenterology;  Laterality: N/A;   HOT HEMOSTASIS  N/A 01/09/2022   Procedure: HOT HEMOSTASIS (ARGON PLASMA COAGULATION/BICAP);  Surgeon: Rollin Dover, MD;  Location: [facility];  Service: Gastroenterology;  Laterality: N/A;   KIDNEY TRANSPLANT     UTERINE FIBROIDECTOMY  07/1998     Home Medications:  Prior to Admission medications  Medication Sig Start Date End Date Taking? Authorizing Provider  acetaminophen (TYLENOL) 500 MG tablet Take 1,000 mg by mouth every 6 (six) hours as needed for moderate pain.   Yes [provider]  albuterol  (VENTOLIN  HFA) 108 (90 Base) MCG/ACT inhaler Inhale  2 puffs into the lungs every 6 (six) hours as needed for wheezing or shortness of breath.   Yes [provider]  aspirin 81 MG chewable tablet Chew 81 mg by mouth daily.   Yes [provider]  carvedilol  (COREG ) 25 MG tablet Take 37.5 mg by mouth 2 (two) times daily with a meal.   Yes [provider]  cetirizine (ZYRTEC) 10 MG tablet Take 10 mg by mouth daily.   Yes [provider]  diphenhydrAMINE (BENADRYL) 25 MG tablet Take 25 mg by mouth at bedtime as needed for sleep.   Yes [provider]  losartan  (COZAAR ) 100 MG tablet Take 100 mg by mouth daily.   Yes [provider]  magnesium oxide (MAG-OX) 400 (240 Mg) MG tablet Take 400 mg by mouth at bedtime.   Yes [provider]  Multiple Vitamins-Minerals (CENTRUM ADULTS PO) Take 1 tablet by mouth daily.   Yes [provider]  mycophenolate  (CELLCEPT ) 500 MG tablet Take 500 mg by mouth 2 (two) times daily. Takes at 7:45 am and pm 04/27/19  Yes [provider]  Omega-3 Fatty Acids (FISH OIL) 1000 MG CAPS Take 1,000 mg by mouth daily.   Yes [provider]  omeprazole (PRILOSEC) 20 MG capsule Take 20 mg by mouth daily.   Yes [provider]  Polyethyl Glyc-Propyl Glyc PF (SYSTANE HYDRATION PF) 0.4-0.3 % SOLN Place 1 drop into both eyes daily as needed (dry eyes).   Yes [provider]  predniSONE  (DELTASONE ) 5 MG tablet Take 5 mg by mouth daily. Prefers to take at 7:45 AM   Yes [provider]  Probiotic Product (PROBIOTIC PO) Take 1 capsule by mouth daily.   Yes [provider]  rosuvastatin (CRESTOR) 5 MG tablet Take 5 mg by mouth daily.   Yes [provider]  Sirolimus  0.5 MG TABS Take 1 tablet by mouth daily. Prefers to take at 7:45 AM   Yes [provider]  venlafaxine XR (EFFEXOR XR) 150 MG 24 hr capsule Take 150 mg by mouth daily with breakfast. Prefers to take at 7:45 AM   Yes [provider]     Scheduled Meds:  loratadine  10 mg Oral Daily   magnesium oxide  400 mg Oral QHS   metoprolol tartrate  25 mg Oral QID   multivitamin with minerals  1 tablet Oral Daily   mycophenolate   500 mg Oral BID   pantoprazole  40 mg Oral Daily   predniSONE   5 mg Oral Daily   psyllium  1 packet Oral Daily   rosuvastatin  5 mg Oral Q24H   Sirolimus   0.5 mg Oral q AM   venlafaxine XR  150 mg Oral Q breakfast   Continuous Infusions:  cefTRIAXone (ROCEPHIN)  IV Stopped (04/20/24 1430)   diltiazem (CARDIZEM) infusion 10 mg/hr (04/21/24 1024)   heparin 800 Units/hr (04/21/24 0748)   PRN Meds: acetaminophen, Gerhardt's butt  cream, ipratropium-albuterol   Allergies:   Allergies[1]  Social History:   Social History   Socioeconomic History   Marital status: Married    Spouse name: Not on file   Number of children: 1   Years of education: Not on file   Highest education level: Not on file  Occupational History   Occupation: Accounting  Tobacco Use   Smoking status: Former    Current packs/day: 0.00    Types: Cigarettes    Quit date: 05/18/1990    Years since quitting: 33.9   Smokeless tobacco: Never  Vaping Use   Vaping status: Never Used  Substance and Sexual Activity   Alcohol use: No   Drug use: No   Sexual activity: Not on file  Other Topics Concern   Not on file  Social History Narrative   Not on file   Social Drivers of Health   Tobacco Use: Medium Risk (04/19/2024)   Patient History    Smoking Tobacco Use: Former    Smokeless Tobacco Use: Never    Passive Exposure: Not on Actuary Strain: Not on file  Food Insecurity: No Food Insecurity (04/19/2024)   Epic    Worried About Programme Researcher, Broadcasting/film/video in the Last Year: Never true    Ran Out of Food in the Last Year: Never true  Transportation Needs: No Transportation Needs (04/19/2024)   Epic    Lack of Transportation (Medical): No    Lack of Transportation (Non-Medical): No  Physical Activity: Not on  file  Stress: Not on file  Social Connections: Moderately Isolated (04/19/2024)   Social Connection and Isolation Panel    Frequency of Communication with Friends and Family: More than three times a week    Frequency of Social Gatherings with Friends and Family: More than three times a week    Attends Religious Services: Never    Database Administrator or Organizations: No    Attends Banker Meetings: Never    Marital Status: Married  Catering Manager Violence: Not At Risk (04/19/2024)   Epic    Fear of Current or Ex-Partner: No    Emotionally Abused: No    Physically Abused: No    Sexually Abused: No  Depression (PHQ2-9): Not on file  Alcohol Screen: Not on file  Housing: Low Risk (04/19/2024)   Epic    Unable to Pay for Housing in the Last Year: No    Number of Times Moved in the Last Year: 0    Homeless in the Last Year: No  Utilities: Not At Risk (04/19/2024)   Epic    Threatened with loss of utilities: No  Health Literacy: Not on file    Family History:   Family History  Problem Relation Age of Onset   Hypertension Mother      ROS:  Please see the history of present illness.  All other ROS reviewed and negative.     Physical Exam/Data: Vitals:   04/21/24 0029 04/21/24 0537 04/21/24 0835 04/21/24 1209  BP: 134/75 (!) 156/93 139/81 (!) 141/94  Pulse: 63 89 89 77  Resp: 18 18 16 18   Temp: 98.9 F (37.2 C) 98.5 F (36.9 C) 98.7 F (37.1 C) 99.2 F (37.3 C)  TempSrc: Oral Oral Oral Oral  SpO2: 98% 97% 98%   Weight:      Height:        Intake/Output Summary (Last 24 hours) at 04/21/2024 1225 Last data filed at 04/21/2024 0645 Gross per  24 hour  Intake 971.78 ml  Output --  Net 971.78 ml      04/19/2024   10:01 AM 05/17/2023    8:08 AM 04/06/2022    8:32 AM  Last 3 Weights  Weight (lbs) 169 lb 12.1 oz 169 lb 12.8 oz 170 lb 6.4 oz  Weight (kg) 77 kg 77.021 kg 77.293 kg     Body mass index is 29.14 kg/m.  General:  Well nourished, well  developed, in no acute distress HEENT: normal Neck: no JVD Vascular: No carotid bruits; Distal pulses 2+ bilaterally Cardiac:  irreg-irreg; no murmurs, gallops or rubs Lungs:  CTA b/l, no wheezing, rhonchi or rales  Abd: soft, nontender Ext: no edema Musculoskeletal:  No deformities Skin: warm and dry  Neuro:  no focal abnormalities noted Psych:  Normal affect   EKG:  The EKG was personally reviewed and demonstrates:    #1 AFib 147bpm #2 SR 67bpm, normal intervals #3 AFlutter 173bpm #4 (today): AFib 88bpm  OLD 05/17/23: SR 84bpm   Telemetry:  Telemetry was personally reviewed and demonstrates:   Currently AFib 70s She is very paroxysmal, sinus rates 60's, AFib rates generally 80's  Relevant CV Studies:   04/19/24: TTE 1. Left ventricular ejection fraction, by estimation, is 60 to 65%. The  left ventricle has normal function. The left ventricle has no regional  wall motion abnormalities. Left ventricular diastolic parameters were  normal.   2. Right ventricular systolic function is normal. The right ventricular  size is normal.   3. The mitral valve is normal in structure. No evidence of mitral valve  regurgitation. No evidence of mitral stenosis.   4. The aortic valve is tricuspid. Aortic valve regurgitation is not  visualized. No aortic stenosis is present.   5. The inferior vena cava is normal in size with greater than 50%  respiratory variability, suggesting right atrial pressure of 3 mmHg.   Laboratory Data: High Sensitivity Troponin:  No results for input(s): TROPONINIHS in the last 720 hours.  Recent Labs  Lab 04/19/24 0158  TRNPT 38*      Chemistry Recent Labs  Lab 04/19/24 0158 04/20/24 0639 04/21/24 0006  NA 132* 137 137  K 3.9 3.7 3.9  CL 98 104 104  CO2 20* 20* 21*  GLUCOSE 126* 155* 158*  BUN 27* 28* 26*  CREATININE 1.70* 0.97 0.89  CALCIUM 8.9 8.3* 8.4*  MG 1.9  --   --   GFRNONAA 34* >60 >60  ANIONGAP 15 13 12     Recent Labs  Lab  04/19/24 0158  PROT 6.7  ALBUMIN 3.7  AST 35  ALT 40  ALKPHOS 96  BILITOT 0.4   Lipids  Recent Labs  Lab 04/19/24 1141  CHOL 177  TRIG 183*  HDL 43  LDLCALC 98  CHOLHDL 4.1    Hematology Recent Labs  Lab 04/19/24 0158 04/20/24 0639 04/21/24 0006  WBC 15.6* 10.3 9.0  RBC 5.51* 4.21 4.16  4.23  HGB 12.4 9.4* 9.4*  HCT 40.0 29.8* 29.8*  MCV 72.6* 70.8* 71.6*  MCH 22.5* 22.3* 22.6*  MCHC 31.0 31.5 31.5  RDW 17.3* 17.0* 17.1*  PLT 566* 450* 480*   Thyroid   Recent Labs  Lab 04/19/24 1141 04/20/24 0639  TSH 0.275*  --   FREET4  --  0.97    BNP Recent Labs  Lab 04/19/24 0158  PROBNP 1,855.0*    DDimer No results for input(s): DDIMER in the last 168 hours.  Radiology/Studies:   CT  CHEST ABDOMEN PELVIS WO CONTRAST Result Date: 04/19/2024 EXAM: CT CHEST, ABDOMEN AND PELVIS WITHOUT CONTRAST 04/19/2024 02:49:25 AM TECHNIQUE: CT of the chest, abdomen and pelvis was performed without the administration of intravenous contrast. Multiplanar reformatted images are provided for review. Automated exposure control, iterative reconstruction, and/or weight based adjustment of the mA/kV was utilized to reduce the radiation dose to as low as reasonably achievable. COMPARISON: None available. CLINICAL HISTORY: abdominal pain, hypotension, kidney transplant previously FINDINGS: CHEST: MEDIASTINUM AND LYMPH NODES: Heart and pericardium are unremarkable. The central airways are clear. No mediastinal, hilar or axillary lymphadenopathy. LUNGS AND PLEURA: No focal consolidation or pulmonary edema. No pleural effusion. No pneumothorax. ABDOMEN AND PELVIS: LIVER: Unremarkable. GALLBLADDER AND BILE DUCTS: The gallbladder is distended. No biliary ductal dilatation. SPLEEN: No acute abnormality. PANCREAS: No acute abnormality. ADRENAL GLANDS: No acute abnormality. KIDNEYS, URETERS AND BLADDER: Both native kidneys are severely atrophic. Normal appearance of right lower quadrant transplant kidney.  No hydronephrosis. No perinephric or periureteral stranding. Urinary bladder is unremarkable. GI AND BOWEL: Mild inflammatory change adjacent to the splenic flexure of the colon. Stomach demonstrates no acute abnormality. There is no bowel obstruction. REPRODUCTIVE ORGANS: No acute abnormality. PERITONEUM AND RETROPERITONEUM: No ascites. No free air. VASCULATURE: Calcific aortic atherosclerosis. Aorta is normal in caliber. ABDOMINAL AND PELVIS LYMPH NODES: No lymphadenopathy. REPRODUCTIVE ORGANS: No acute abnormality. BONES AND SOFT TISSUES: No acute osseous abnormality. No focal soft tissue abnormality. IMPRESSION: 1. Mild colitis at the splenic flexure of the colon. 2. Distended gallbladder. 3. Normal appearance of the right lower quadrant transplant kidney. Electronically signed by: Franky Stanford MD 04/19/2024 03:35 AM EST RP Workstation: [id number]   CT Head Wo Contrast Result Date: 04/19/2024 EXAM: CT HEAD WITHOUT CONTRAST 04/19/2024 02:49:25 AM TECHNIQUE: CT of the head was performed without the administration of intravenous contrast. Automated exposure control, iterative reconstruction, and/or weight based adjustment of the mA/kV was utilized to reduce the radiation dose to as low as reasonably achievable. COMPARISON: None available. CLINICAL HISTORY: Head trauma, moderate-severe FINDINGS: BRAIN AND VENTRICLES: No acute hemorrhage. No evidence of acute infarct. No hydrocephalus. No extra-axial collection. No mass effect or midline shift. ORBITS: No acute abnormality. SINUSES: No acute abnormality. SOFT TISSUES AND SKULL: No acute soft tissue abnormality. No skull fracture. IMPRESSION: 1. No acute intracranial abnormality. Electronically signed by: Franky Stanford MD 04/19/2024 03:10 AM EST RP Workstation: [id number]   DG Chest Port 1 View Result Date: 04/19/2024 EXAM: 1 VIEW XRAY OF THE CHEST 04/19/2024 01:48:00 AM COMPARISON: 07/11/2021 CLINICAL HISTORY: Questionable sepsis - evaluate for abnormality  FINDINGS: LUNGS AND PLEURA: No focal pulmonary opacity. No pleural effusion. No pneumothorax. HEART AND MEDIASTINUM: No acute abnormality of the cardiac and mediastinal silhouettes. BONES AND SOFT TISSUES: No acute osseous abnormality. IMPRESSION: 1. No acute process. Electronically signed by: Franky Stanford MD 04/19/2024 02:29 AM EST RP Workstation: [id number]     Assessment and Plan:  Paroxysmal AFib AFlutter (suspect atypical) In setting of an acute illness Last year, in setting of an URI type illness reported palpitations  Not known though to have previously had arrhythmias/AFib/flutter CHA2DS2Vasc is 3 (HTN, remote hx of NICM, and gender) > heparin gtt here  In review of [facility] note from Sept 2011: May of 2011. She had an EF of 20% at that time. A catheterization showed clean coronary arteries. She transitioned from PD to hemo and then ultimately back to PD and as of about a month ago a MUGA scan done locally showed an EF of 40%  Subsequent echos w/recovered LVEF  AAD options are limited In d/w RPH today  -- sirolimus  interactions can be problematic (amio can increase sirolimus  level, but these can be monitored) -- Hesitation w/Tikosyn for her coming in w/AKI on a transplanted kidney Her LVEF EF has remained recovered for years In regards to a/c OAC not contraindicated, RPH recommends monitoring of sirolimus  levels  Dr. Inocencio has been bedside. She is rate controlled and asymptomatic Her AFib very paroxysmal, she has had intermittent SR today In setting of acute illness.  Do not recommend AAD at this juncture Agree with attending cardiology team transitioning to beta blocker with up-titration of the metoprolol today Eventually would consolidate to Toprol once final dose achieved  Odilon Cass consult pharmacy team for eliquis (from heparin) and recommendations for sirolimus  surveillance (to be done by her outpatient nephrology/transplant/IM teams)  Emmaleigh Longo make EP follow up in the  office    For questions or updates, please contact [facility] Please consult [email or web address] for contact info under    Signed, Charlies Macario Arthur, PA-C  04/21/2024 12:25 PM  I have seen and examined this patient with Charlies Arthur.  Agree with above, note added to reflect my findings.  Patient with a past medical history as above.  She presented to the hospital with a febrile illness and was found to be positive for Salmonella.  She has had episodes of atrial fibrillation while in the hospital.  Initially, her rates were quite rapid in atrial fibrillation.  With medication adjustment, her rates in atrial fibrillation have been improved.  She is on both diltiazem and heparin.  She is feeling well with some mild palpitations, but otherwise has no acute complaints.  GEN: No acute distress.   Neck: No JVD Cardiac: Irregular, no murmurs, rubs, or gallops.  Respiratory: normal BS bases bilaterally. GI: Soft, nontender, non-distended  MS: No edema; No deformity. Neuro:  Nonfocal  Skin: warm  Psych: Normal affect    Paroxysmal atrial fibrillation/atrial flutter: Occurred in the setting of Salmonella infection.  She is having paroxysms of atrial fibrillation had atrial flutter as well.  Her rates are better controlled on diltiazem.  Would switch diltiazem to p.o. if this is with her antirejection medications.  If not, would switch to metoprolol we Lillionna Nabi start her on Eliquis.  Her CHA2DS2-VASc is at least 2.  Hopefully this can be a short-term medication.  We have consulted pharmacy for recommendations on dosing of sirolimus  and Eliquis.  This Naziah Portee need to be followed as an outpatient.  Lavern Maslow M. Montrey Buist MD 04/21/2024 6:44 PM     [1]  Allergies Allergen Reactions   Hydrocodone-Acetaminophen Itching   Lisinopril Cough   "

## 2024-04-21 NOTE — Progress Notes (Signed)
 "  [facility] PROGRESS NOTE   Erin Ward FMW:[id number] DOB: [birth year] DOA: 04/19/2024  PCP: [name], MD  Brief History:  64 y.o. 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  Patient was hospitalized for further management.   Consultants: Cardiology  Procedures: Echocardiogram  Subjective/Interval History: Patient wants to go home.  Had some chest tightness overnight but none currently.  Diarrhea has improved.  Denies any abdominal pain.  Has been urinating well.   Assessment/Plan:  Salmonella enteritis/colitis Patient came in with diarrhea.  C. difficile is negative.  GI pathogen panel positive for Salmonella species. Patient is noted to be on ceftriaxone.   She remains afebrile with normal WBC. Diarrhea has subsided.  Continue ceftriaxone for now.    Atrial fibrillation with RVR Appears to be a new diagnosis for her.  This is in the setting of her GI illness. TSH noted to be 0.275.  Free T4 is normal at 0.97.  Likely sick euthyroid. Patient was on amiodarone infusion which has been switched over to diltiazem infusion.  Patient is on heparin infusion. Echocardiogram shows normal LVEF.  Further management per cardiology. Repeat EKG today.  Acute kidney injury/history of renal transplant Patient underwent transplant at [facility].  She is followed by local nephrology. Creatinine was 1.7 at presentation.  BUN was also elevated.  Care Everywhere was reviewed.  Creatinine was noted to be 0.97 in August. This was likely acute kidney injury due to GI loss. Patient's renal function is back to baseline with IV hydration.  Since diarrhea has resolved we will stop her IV fluids. She is back on her transplant medications.  She is noted  to be on CellCept , sirolimus  and prednisone . Discussed with nephrology who agrees with this plan.  Microcytic anemia Significant drop in hemoglobin noted which is likely dilutional.  Patient did see streaks of blood in her stool but no profuse bleeding has been noted.  Hemoglobin is stable this morning. Based on Care Everywhere patient's hemoglobin was 11.5 in August. Anemia panel was done and shows ferritin of 53, iron 29, TIBC 276, percent saturation 10.  Folic acid 20.  Vitamin B12 947.  Essential hypertension Patient's carvedilol  is on hold.  Patient is on metoprolol and Cardizem.  Patient also noted to be on losartan ..   Monitor blood pressures closely.   DVT Prophylaxis: On IV heparin Code Status: Full code Family Communication: Discussed with patient and her husband Disposition Plan: Hopefully return home when cleared by cardiology    Medications: Scheduled:  loratadine  10 mg Oral Daily   losartan   25 mg Oral Daily   magnesium oxide  400 mg Oral QHS   metoprolol tartrate  25 mg Oral BID   multivitamin with minerals  1 tablet Oral Daily   mycophenolate   500 mg Oral BID   pantoprazole  40 mg Oral Daily   predniSONE   5 mg Oral Daily   psyllium  1 packet Oral Daily   rosuvastatin  5 mg Oral Q24H   Sirolimus   0.5 mg Oral q AM   venlafaxine XR  150 mg Oral Q breakfast   Continuous:  sodium chloride  50 mL/hr at 04/20/24 1617   cefTRIAXone (ROCEPHIN)  IV Stopped (04/20/24 1430)   diltiazem (CARDIZEM) infusion 10 mg/hr (04/20/24  2049)   heparin 800 Units/hr (04/21/24 0748)   PRN:Gerhardt's butt cream, ipratropium-albuterol   Antibiotics: Anti-infectives (From admission, onward)    Start     Dose/Rate Route Frequency Ordered Stop   04/19/24 1315  cefTRIAXone (ROCEPHIN) 2 g in sodium chloride  0.9 % 100 mL IVPB        2 g 200 mL/hr over 30 Minutes Intravenous Every 24 hours 04/19/24 1302     04/19/24 1100  piperacillin-tazobactam (ZOSYN) IVPB 3.375 g  Status:   Discontinued        3.375 g 12.5 mL/hr over 240 Minutes Intravenous Every 8 hours 04/19/24 1014 04/19/24 1302   04/19/24 0230  piperacillin-tazobactam (ZOSYN) IVPB 3.375 g        3.375 g 100 mL/hr over 30 Minutes Intravenous  Once 04/19/24 0227 04/19/24 0352       Objective:  Vital Signs  Vitals:   04/20/24 2029 04/21/24 0029 04/21/24 0537 04/21/24 0835  BP: 131/77 134/75 (!) 156/93 139/81  Pulse: 74 63 89 89  Resp: 17 18 18 16   Temp: 98.8 F (37.1 C) 98.9 F (37.2 C) 98.5 F (36.9 C) 98.7 F (37.1 C)  TempSrc: Oral Oral Oral Oral  SpO2: 97% 98% 97% 98%  Weight:      Height:        Intake/Output Summary (Last 24 hours) at 04/21/2024 0933 Last data filed at 04/21/2024 0645 Gross per 24 hour  Intake 971.78 ml  Output --  Net 971.78 ml   Filed Weights   04/19/24 1001  Weight: 77 kg    General appearance: Awake alert.  In no distress Resp: Clear to auscultation bilaterally.  Normal effort Cardio: S1-S2 is irregularly irregular  GI: Abdomen is soft.  Nontender nondistended.  Bowel sounds are present normal.  No masses organomegaly Extremities: No edema.  Full range of motion of lower extremities. Neurologic: Alert and oriented x3.  No focal neurological deficits.     Lab Results:  Data Reviewed: I have personally reviewed following labs and reports of the imaging studies  CBC: Recent Labs  Lab 04/19/24 0158 04/20/24 0639 04/21/24 0006  WBC 15.6* 10.3 9.0  NEUTROABS 12.5*  --   --   HGB 12.4 9.4* 9.4*  HCT 40.0 29.8* 29.8*  MCV 72.6* 70.8* 71.6*  PLT 566* 450* 480*    Basic Metabolic Panel: Recent Labs  Lab 04/19/24 0158 04/20/24 0639 04/21/24 0006  NA 132* 137 137  K 3.9 3.7 3.9  CL 98 104 104  CO2 20* 20* 21*  GLUCOSE 126* 155* 158*  BUN 27* 28* 26*  CREATININE 1.70* 0.97 0.89  CALCIUM 8.9 8.3* 8.4*  MG 1.9  --   --     GFR: Estimated Creatinine Clearance: 66.6 mL/min (by C-G formula based on SCr of 0.89 mg/dL).  Liver Function  Tests: Recent Labs  Lab 04/19/24 0158  AST 35  ALT 40  ALKPHOS 96  BILITOT 0.4  PROT 6.7  ALBUMIN 3.7    Coagulation Profile: Recent Labs  Lab 04/19/24 0158  INR 1.1    BNP (last 3 results) Recent Labs    04/19/24 0158  PROBNP 1,855.0*    HbA1C: Recent Labs    04/19/24 1141  HGBA1C 6.1*   Lipid Profile: Recent Labs    04/19/24 1141  CHOL 177  HDL 43  LDLCALC 98  TRIG 183*  CHOLHDL 4.1    Thyroid  Function Tests: Recent Labs    04/19/24 1141 04/20/24 0639  TSH 0.275*  --  FREET4  --  0.97    Recent Results (from the past 240 hours)  Blood Culture (routine x 2)     Status: None (Preliminary result)   Collection Time: 04/19/24  1:20 AM   Specimen: BLOOD  Result Value Ref Range Status   Specimen Description BLOOD RIGHT ANTECUBITAL  Final   Special Requests   Final    BOTTLES DRAWN AEROBIC ONLY Blood Culture adequate volume   Culture   Final    NO GROWTH 2 DAYS Performed at [facility], [address], [place], [place] [address]    Report Status PENDING  Incomplete  Blood Culture (routine x 2)     Status: None (Preliminary result)   Collection Time: 04/19/24  1:25 AM   Specimen: BLOOD  Result Value Ref Range Status   Specimen Description BLOOD SITE NOT SPECIFIED  Final   Special Requests   Final    BOTTLES DRAWN AEROBIC AND ANAEROBIC Blood Culture adequate volume   Culture   Final    NO GROWTH 2 DAYS Performed at [facility], [address], [place], [place] [address]    Report Status PENDING  Incomplete  C Difficile Quick Screen w PCR reflex     Status: None   Collection Time: 04/19/24  2:27 AM   Specimen: Stool  Result Value Ref Range Status   C Diff antigen NEGATIVE NEGATIVE Final   C Diff toxin NEGATIVE NEGATIVE Final   C Diff interpretation No C. difficile detected.  Final    Comment: Performed at [facility], [address], [place], [place] [address]  Gastrointestinal Panel by PCR , Stool     Status: Abnormal    Collection Time: 04/19/24  2:27 AM   Specimen: Stool  Result Value Ref Range Status   Campylobacter species NOT DETECTED NOT DETECTED Final   Plesimonas shigelloides NOT DETECTED NOT DETECTED Final   Salmonella species DETECTED (A) NOT DETECTED Final    Comment: RESULT CALLED TO, READ BACK BY AND VERIFIED WITH: MARSHA ADA MD AT 1114 04/19/2024 DLB    Yersinia enterocolitica NOT DETECTED NOT DETECTED Final   Vibrio species NOT DETECTED NOT DETECTED Final   Vibrio cholerae NOT DETECTED NOT DETECTED Final   Enteroaggregative E coli (EAEC) NOT DETECTED NOT DETECTED Final   Enteropathogenic E coli (EPEC) NOT DETECTED NOT DETECTED Final   Enterotoxigenic E coli (ETEC) NOT DETECTED NOT DETECTED Final   Shiga like toxin producing E coli (STEC) NOT DETECTED NOT DETECTED Final   Shigella/Enteroinvasive E coli (EIEC) NOT DETECTED NOT DETECTED Final   Cryptosporidium NOT DETECTED NOT DETECTED Final   Cyclospora cayetanensis NOT DETECTED NOT DETECTED Final   Entamoeba histolytica NOT DETECTED NOT DETECTED Final   Giardia lamblia NOT DETECTED NOT DETECTED Final   Adenovirus F40/41 NOT DETECTED NOT DETECTED Final   Astrovirus NOT DETECTED NOT DETECTED Final   Norovirus GI/GII NOT DETECTED NOT DETECTED Final   Rotavirus A NOT DETECTED NOT DETECTED Final   Sapovirus (I, II, IV, and V) NOT DETECTED NOT DETECTED Final    Comment: Performed at [facility], [address], [place], [place] [address]      Radiology Studies: ECHOCARDIOGRAM COMPLETE Result Date: 04/19/2024    ECHOCARDIOGRAM REPORT   Patient Name:   AL GAGEN Date of Exam: 04/19/2024 Medical Rec #:  [id number]     Height:       64.0 in Accession #:    [id number]  Weight:       169.8 lb Date of Birth:  [birth year]     BSA:          1.825 m Patient Age:    63 years      BP:           87/59 mmHg Patient Gender: F             HR:           68 bpm. Exam Location:  Inpatient Procedure: 2D Echo (Both Spectral and Color Flow  Doppler were utilized during            procedure). Indications:    Atrial fibrillation  History:        Patient has prior history of Echocardiogram examinations, most                 recent 06/01/2019. Risk Factors:Hypertension and Dyslipidemia.  Sonographer:    Tinnie Barefoot RDCS Referring Phys: [id number] ROLLO JONELLE LOUDER IMPRESSIONS  1. Left ventricular ejection fraction, by estimation, is 60 to 65%. The left ventricle has normal function. The left ventricle has no regional wall motion abnormalities. Left ventricular diastolic parameters were normal.  2. Right ventricular systolic function is normal. The right ventricular size is normal.  3. The mitral valve is normal in structure. No evidence of mitral valve regurgitation. No evidence of mitral stenosis.  4. The aortic valve is tricuspid. Aortic valve regurgitation is not visualized. No aortic stenosis is present.  5. The inferior vena cava is normal in size with greater than 50% respiratory variability, suggesting right atrial pressure of 3 mmHg. FINDINGS  Left Ventricle: Left ventricular ejection fraction, by estimation, is 60 to 65%. The left ventricle has normal function. The left ventricle has no regional wall motion abnormalities. The left ventricular internal cavity size was normal in size. There is  no left ventricular hypertrophy. Left ventricular diastolic parameters were normal. Right Ventricle: The right ventricular size is normal. Right ventricular systolic function is normal. Left Atrium: Left atrial size was normal in size. Right Atrium: Right atrial size was normal in size. Pericardium: There is no evidence of pericardial effusion. Mitral Valve: The mitral valve is normal in structure. No evidence of mitral valve regurgitation. No evidence of mitral valve stenosis. Tricuspid Valve: The tricuspid valve is normal in structure. Tricuspid valve regurgitation is trivial. No evidence of tricuspid stenosis. Aortic Valve: The aortic valve is  tricuspid. Aortic valve regurgitation is not visualized. No aortic stenosis is present. Pulmonic Valve: The pulmonic valve was normal in structure. Pulmonic valve regurgitation is not visualized. No evidence of pulmonic stenosis. Aorta: The aortic root is normal in size and structure. Venous: The inferior vena cava is normal in size with greater than 50% respiratory variability, suggesting right atrial pressure of 3 mmHg. IAS/Shunts: No atrial level shunt detected by color flow Doppler.  LEFT VENTRICLE PLAX 2D LVIDd:         4.30 cm     Diastology LVIDs:         3.00 cm     LV e' medial:    9.90 cm/s LV PW:         1.00 cm     LV E/e' medial:  7.9 LV IVS:        0.90 cm     LV e' lateral:   10.40 cm/s LVOT diam:     1.80 cm     LV E/e' lateral: 7.6 LV SV:  47 LV SV Index:   26 LVOT Area:     2.54 cm LV IVRT:       92 msec  LV Volumes (MOD) LV vol d, MOD A2C: 67.2 ml LV vol d, MOD A4C: 65.6 ml LV vol s, MOD A2C: 29.2 ml LV vol s, MOD A4C: 27.1 ml LV SV MOD A2C:     38.0 ml LV SV MOD A4C:     65.6 ml LV SV MOD BP:      40.5 ml RIGHT VENTRICLE             IVC RV Basal diam:  2.50 cm     IVC diam: 1.90 cm RV S prime:     16.20 cm/s TAPSE (M-mode): 2.4 cm      PULMONARY VEINS                             Diastolic Velocity: 34.60 cm/s                             S/D Velocity:       1.20                             Systolic Velocity:  41.20 cm/s LEFT ATRIUM             Index        RIGHT ATRIUM           Index LA diam:        3.60 cm 1.97 cm/m   RA Area:     15.10 cm LA Vol (A2C):   54.4 ml 29.81 ml/m  RA Volume:   40.00 ml  21.92 ml/m LA Vol (A4C):   48.7 ml 26.69 ml/m LA Biplane Vol: 53.2 ml 29.16 ml/m  AORTIC VALVE LVOT Vmax:   91.70 cm/s LVOT Vmean:  59.200 cm/s LVOT VTI:    0.183 m  AORTA Ao Root diam: 2.90 cm Ao Asc diam:  3.40 cm MITRAL VALVE MV Area (PHT): 3.72 cm    SHUNTS MV Decel Time: 204 msec    Systemic VTI:  0.18 m MV E velocity: 78.60 cm/s  Systemic Diam: 1.80 cm MV A velocity: 77.60 cm/s MV  E/A ratio:  1.01 Redell Shallow MD Electronically signed by Redell Shallow MD Signature Date/Time: 04/19/2024/6:04:35 PM    Final        LOS: 2 days   Joette Pebbles  [facility] Pager on [email or web address]  04/21/2024, 9:33 AM   "

## 2024-04-21 NOTE — Progress Notes (Addendum)
 PHARMACY - ANTICOAGULATION CONSULT NOTE  Pharmacy Consult for heparin to apixaban Indication: atrial fibrillation  Allergies[1]  Patient Measurements: Height: 5' 4 (162.6 cm) Weight: 77 kg (169 lb 12.1 oz) IBW/kg (Calculated) : 54.7 HEPARIN DW (KG): 71  Vital Signs: Temp: 99.2 F (37.3 C) (01/02 1209) Temp Source: Oral (01/02 1209) BP: 126/90 (01/02 1518) Pulse Rate: 84 (01/02 1518)  Labs: Recent Labs    04/19/24 0158 04/19/24 1141 04/20/24 0639 04/20/24 0835 04/20/24 1709 04/21/24 0006 04/21/24 1421  HGB 12.4  --  9.4*  --   --  9.4* 9.5*  HCT 40.0  --  29.8*  --   --  29.8* 30.6*  PLT 566*  --  450*  --   --  480*  --   LABPROT 14.7  --   --   --   --   --   --   INR 1.1  --   --   --   --   --   --   HEPARINUNFRC  --    < >  --  >1.10* 0.58 0.47  --   CREATININE 1.70*  --  0.97  --   --  0.89  --    < > = values in this interval not displayed.    Estimated Creatinine Clearance: 66.6 mL/min (by C-G formula based on SCr of 0.89 mg/dL).   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 year old female admitted on 04/19/2024 with concern for afib RVR. PMH significant for kidney transplant with possible transfer to [facility] if bed available. No anticoagulation prior to admission. Pharmacy consulted to dose heparin and now plans to transition to apixaban  -SCr 0.89, Wt 77kg -hg 9.4 and stable  She  is also on sirolimus  which used same P-gp transporter as apixaban. Based on what I can find, a drug interaction is not anticipated.  -last sirolimus  level was 8.4 on 04/19/24 and range has been 72.-9.2 since 2003  Goal of Therapy:  Monitor platelets by anticoagulation protocol: Yes   Plan:  -Discontinue heparin  -Sart apixaban 5mg  po bid  -Based on the drug steady state, could consider a sirolimus  level in 5- 7 days after apixaban added (could consider up to 14 days if needed)   Prentice Poisson, PharmD Clinical Pharmacist **Pharmacist phone directory can now be found on [email or web address] ([email or web address]).  Listed under [facility].        [1]  Allergies Allergen Reactions   Hydrocodone-Acetaminophen Itching   Lisinopril Cough

## 2024-04-22 DIAGNOSIS — A020 Salmonella enteritis: Secondary | ICD-10-CM | POA: Diagnosis not present

## 2024-04-22 DIAGNOSIS — Z940 Kidney transplant status: Secondary | ICD-10-CM | POA: Diagnosis not present

## 2024-04-22 DIAGNOSIS — I4891 Unspecified atrial fibrillation: Secondary | ICD-10-CM | POA: Diagnosis not present

## 2024-04-22 LAB — MAGNESIUM: Magnesium: 2 mg/dL (ref 1.7–2.4)

## 2024-04-22 LAB — CBC
HCT: 30.1 % — ABNORMAL LOW (ref 36.0–46.0)
Hemoglobin: 9.6 g/dL — ABNORMAL LOW (ref 12.0–15.0)
MCH: 22.7 pg — ABNORMAL LOW (ref 26.0–34.0)
MCHC: 31.9 g/dL (ref 30.0–36.0)
MCV: 71.3 fL — ABNORMAL LOW (ref 80.0–100.0)
Platelets: 484 K/uL — ABNORMAL HIGH (ref 150–400)
RBC: 4.22 MIL/uL (ref 3.87–5.11)
RDW: 17.2 % — ABNORMAL HIGH (ref 11.5–15.5)
WBC: 13.7 K/uL — ABNORMAL HIGH (ref 4.0–10.5)
nRBC: 0 % (ref 0.0–0.2)

## 2024-04-22 LAB — RESP PANEL BY RT-PCR (RSV, FLU A&B, COVID)  RVPGX2
Influenza A by PCR: NEGATIVE
Influenza B by PCR: NEGATIVE
Resp Syncytial Virus by PCR: NEGATIVE
SARS Coronavirus 2 by RT PCR: NEGATIVE

## 2024-04-22 LAB — BASIC METABOLIC PANEL WITH GFR
Anion gap: 11 (ref 5–15)
BUN: 14 mg/dL (ref 8–23)
CO2: 24 mmol/L (ref 22–32)
Calcium: 8.7 mg/dL — ABNORMAL LOW (ref 8.9–10.3)
Chloride: 101 mmol/L (ref 98–111)
Creatinine, Ser: 0.78 mg/dL (ref 0.44–1.00)
GFR, Estimated: >60 mL/min
Glucose, Bld: 104 mg/dL — ABNORMAL HIGH (ref 70–99)
Potassium: 3.9 mmol/L (ref 3.5–5.1)
Sodium: 137 mmol/L (ref 135–145)

## 2024-04-22 LAB — GROUP A STREP BY PCR: Group A Strep by PCR: NOT DETECTED

## 2024-04-22 MED ORDER — AMIODARONE HCL IN DEXTROSE 360-4.14 MG/200ML-% IV SOLN
60.0000 mg/h | INTRAVENOUS | Status: AC
  Administered 2024-04-22 (×2): 60 mg/h via INTRAVENOUS
  Filled 2024-04-22 (×3): qty 200

## 2024-04-22 MED ORDER — METOPROLOL TARTRATE 100 MG PO TABS
100.0000 mg | ORAL_TABLET | Freq: Two times a day (BID) | ORAL | Status: DC
  Administered 2024-04-22 – 2024-04-23 (×3): 100 mg via ORAL
  Filled 2024-04-22 (×3): qty 1

## 2024-04-22 MED ORDER — AMIODARONE HCL IN DEXTROSE 360-4.14 MG/200ML-% IV SOLN
30.0000 mg/h | INTRAVENOUS | Status: AC
  Administered 2024-04-22 – 2024-04-23 (×3): 30 mg/h via INTRAVENOUS
  Filled 2024-04-22 (×3): qty 200

## 2024-04-22 MED ORDER — MENTHOL 3 MG MT LOZG
1.0000 | LOZENGE | OROMUCOSAL | Status: DC | PRN
  Administered 2024-04-22: 3 mg via ORAL
  Filled 2024-04-22: qty 9

## 2024-04-22 MED ORDER — POTASSIUM CHLORIDE CRYS ER 20 MEQ PO TBCR
40.0000 meq | EXTENDED_RELEASE_TABLET | Freq: Once | ORAL | Status: AC
  Administered 2024-04-22: 40 meq via ORAL
  Filled 2024-04-22: qty 2

## 2024-04-22 MED ORDER — ONDANSETRON HCL 4 MG/2ML IJ SOLN
4.0000 mg | Freq: Four times a day (QID) | INTRAMUSCULAR | Status: DC | PRN
  Administered 2024-04-22: 4 mg via INTRAVENOUS
  Filled 2024-04-22 (×2): qty 2

## 2024-04-22 MED ORDER — METOPROLOL TARTRATE 5 MG/5ML IV SOLN
5.0000 mg | Freq: Once | INTRAVENOUS | Status: AC
  Administered 2024-04-22: 5 mg via INTRAVENOUS
  Filled 2024-04-22: qty 5

## 2024-04-22 MED ORDER — AMIODARONE LOAD VIA INFUSION
150.0000 mg | Freq: Once | INTRAVENOUS | Status: AC
  Administered 2024-04-22: 150 mg via INTRAVENOUS
  Filled 2024-04-22: qty 83.34

## 2024-04-22 MED ORDER — METOPROLOL TARTRATE 5 MG/5ML IV SOLN: 5.0000 mg | Freq: Once | INTRAVENOUS | Status: DC

## 2024-04-22 MED ORDER — METOPROLOL TARTRATE 5 MG/5ML IV SOLN
2.5000 mg | INTRAVENOUS | Status: AC
  Administered 2024-04-22: 2.5 mg via INTRAVENOUS
  Filled 2024-04-22: qty 5

## 2024-04-22 NOTE — Progress Notes (Signed)
 Patient still with uncontrolled afib rates (130s-150s) even after amiodarone  bolus and infusion. RN sent message to Dr. Inocencio. Per Dr. Inocencio, metoprolol  can be increased to 100 mg bid and 5 mg IV metoprolol  5 mg can be given now. Currently, patient has just converted back to NSR, so will not give IV metoprolol  at this time.

## 2024-04-22 NOTE — Progress Notes (Signed)
 "  [facility] PROGRESS NOTE   Erin Ward FMW:[id number] DOB: 03-Dec-1960 DOA: 04/19/2024  PCP: [name], MD  Brief History:  64 y.o. 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  Patient was hospitalized for further management.   Consultants: Cardiology  Procedures: Echocardiogram  Subjective/Interval History: Patient complaining of some soreness in her throat this morning.  Noted to have a temperature of 100.8 as well.  Denies any cough.  Denies any chest pain though she does feel fatigued.  Her daughter is at the bedside.  They are hoping to go home today even though her heart rate is noted to fluctuate between sinus rhythm and atrial fibrillation in the 140s.     Assessment/Plan:  Salmonella enteritis/colitis Patient came in with diarrhea.  C. difficile is negative.  GI pathogen panel positive for Salmonella species. Patient was started on ceftriaxone .  Diarrhea appears to have subsided.  WBC noted to be higher today for unclear reasons.  Continue with IV ceftriaxone  for now. Complaining of soreness in her throat.  Had low-grade fever.  Will check flu RSV COVID and also do a strep screen.  Atrial fibrillation with RVR Appears to be a new diagnosis for her.  This is in the setting of her GI illness. TSH noted to be 0.275.  Free T4 is normal at 0.97.  Likely sick euthyroid. Echocardiogram shows normal LVEF.   Patient seems to be going in and out of atrial fibrillation.  More tachycardic this morning. Patient has been seen by cardiology.  Has been on amiodarone  infusion which was changed over to diltiazem  which was subsequently discontinued and she was placed on metoprolol . She was also started on Eliquis  by cardiology. Await  further cardiology input this morning.  Acute kidney injury/history of renal transplant Patient underwent transplant at [facility]  She is followed by local nephrology. Creatinine was 1.7 at presentation.  BUN was also elevated.  Care Everywhere was reviewed.  Creatinine was noted to be 0.97 in August. This was likely acute kidney injury due to GI loss. Patient's renal function is back to baseline with IV hydration.  IV fluids were discontinued. She is back on her transplant medications.  She is noted to be on CellCept , sirolimus  and prednisone . Sirolimus  level to be sent by cardiology.  Microcytic anemia Significant drop in hemoglobin noted which is likely dilutional.  Patient did see streaks of blood in her stool but no profuse bleeding has been noted.  Based on Care Everywhere patient's hemoglobin was 11.5 in August. Anemia panel was done and shows ferritin of 53, iron 29, TIBC 276, percent saturation 10.  Folic acid 20.  Vitamin B12 947. Hemoglobin is low but stable for the last 48 hours.  No overt bleeding noted.  Essential hypertension Monitor blood pressures closely.   DVT Prophylaxis: On Eliquis  Code Status: Full code Family Communication: Discussed with patient and her daughter Disposition Plan: Hopefully return home when cleared by cardiology    Medications: Scheduled:  apixaban   5 mg Oral BID   loratadine   10 mg Oral Daily   magnesium  oxide  400 mg Oral QHS   metoprolol  tartrate  75 mg Oral BID   multivitamin with minerals  1 tablet Oral Daily   mycophenolate   500 mg Oral BID  pantoprazole   40 mg Oral Daily   predniSONE   5 mg Oral Daily   psyllium  1 packet Oral Daily   rosuvastatin   5 mg Oral Q24H   Sirolimus   0.5 mg Oral q AM   venlafaxine  XR  150 mg Oral Q breakfast   Continuous:  amiodarone  60 mg/hr (04/22/24 0942)   amiodarone      cefTRIAXone  (ROCEPHIN )  IV Stopped (04/21/24 1305)   PRN:acetaminophen , Gerhardt's butt cream, ipratropium-albuterol , menthol ,  ondansetron  (ZOFRAN ) IV  Antibiotics: Anti-infectives (From admission, onward)    Start     Dose/Rate Route Frequency Ordered Stop   04/19/24 1315  cefTRIAXone  (ROCEPHIN ) 2 g in sodium chloride  0.9 % 100 mL IVPB        2 g 200 mL/hr over 30 Minutes Intravenous Every 24 hours 04/19/24 1302     04/19/24 1100  piperacillin -tazobactam (ZOSYN ) IVPB 3.375 g  Status:  Discontinued        3.375 g 12.5 mL/hr over 240 Minutes Intravenous Every 8 hours 04/19/24 1014 04/19/24 1302   04/19/24 0230  piperacillin -tazobactam (ZOSYN ) IVPB 3.375 g        3.375 g 100 mL/hr over 30 Minutes Intravenous  Once 04/19/24 0227 04/19/24 0352       Objective:  Vital Signs  Vitals:   04/22/24 0234 04/22/24 0451 04/22/24 0818 04/22/24 0907  BP: (!) 148/90  (!) 134/92   Pulse: (!) 103 (!) 103 (!) 130   Resp: 20 20 20    Temp:  (!) 100.4 F (38 C) 98.8 F (37.1 C) 99.8 F (37.7 C)  TempSrc:  Oral Oral Oral  SpO2:  97% 92%   Weight:      Height:        Intake/Output Summary (Last 24 hours) at 04/22/2024 1001 Last data filed at 04/21/2024 1805 Gross per 24 hour  Intake 255.87 ml  Output --  Net 255.87 ml   Filed Weights   04/19/24 1001  Weight: 77 kg    General appearance: Awake alert.  In no distress No oral lesions noted no significant erythema Resp: Clear to auscultation bilaterally.  Normal effort Cardio: S1-S2 is irregularly irregular, tachycardic  GI: Abdomen is soft.  Nontender nondistended.  Bowel sounds are present normal.  No masses organomegaly Extremities: No edema.  Full range of motion of lower extremities. Neurologic: Alert and oriented x3.  No focal neurological deficits.     Lab Results:  Data Reviewed: I have personally reviewed following labs and reports of the imaging studies  CBC: Recent Labs  Lab 04/19/24 0158 04/20/24 0639 04/21/24 0006 04/21/24 1421 04/22/24 0545  WBC 15.6* 10.3 9.0  --  13.7*  NEUTROABS 12.5*  --   --   --   --   HGB 12.4 9.4* 9.4* 9.5*  9.6*  HCT 40.0 29.8* 29.8* 30.6* 30.1*  MCV 72.6* 70.8* 71.6*  --  71.3*  PLT 566* 450* 480*  --  484*    Basic Metabolic Panel: Recent Labs  Lab 04/19/24 0158 04/20/24 0639 04/21/24 0006 04/22/24 0545  NA 132* 137 137 137  K 3.9 3.7 3.9 3.9  CL 98 104 104 101  CO2 20* 20* 21* 24  GLUCOSE 126* 155* 158* 104*  BUN 27* 28* 26* 14  CREATININE 1.70* 0.97 0.89 0.78  CALCIUM  8.9 8.3* 8.4* 8.7*  MG 1.9  --   --  2.0    GFR: Estimated Creatinine Clearance: 74.1 mL/min (by C-G formula based on SCr of 0.78 mg/dL).  Liver Function  Tests: Recent Labs  Lab 04/19/24 0158  AST 35  ALT 40  ALKPHOS 96  BILITOT 0.4  PROT 6.7  ALBUMIN 3.7    Coagulation Profile: Recent Labs  Lab 04/19/24 0158  INR 1.1    BNP (last 3 results) Recent Labs    04/19/24 0158  PROBNP 1,855.0*    HbA1C: Recent Labs    04/19/24 1141  HGBA1C 6.1*   Lipid Profile: Recent Labs    04/19/24 1141  CHOL 177  HDL 43  LDLCALC 98  TRIG 183*  CHOLHDL 4.1    Thyroid  Function Tests: Recent Labs    04/19/24 1141 04/20/24 0639  TSH 0.275*  --   FREET4  --  0.97    Recent Results (from the past 240 hours)  Blood Culture (routine x 2)     Status: None (Preliminary result)   Collection Time: 04/19/24  1:20 AM   Specimen: BLOOD  Result Value Ref Range Status   Specimen Description BLOOD RIGHT ANTECUBITAL  Final   Special Requests   Final    BOTTLES DRAWN AEROBIC ONLY Blood Culture adequate volume   Culture   Final    NO GROWTH 3 DAYS Performed at [facility], [address] [place], [place] [address]    Report Status PENDING  Incomplete  Blood Culture (routine x 2)     Status: None (Preliminary result)   Collection Time: 04/19/24  1:25 AM   Specimen: BLOOD  Result Value Ref Range Status   Specimen Description BLOOD SITE NOT SPECIFIED  Final   Special Requests   Final    BOTTLES DRAWN AEROBIC AND ANAEROBIC Blood Culture adequate volume   Culture   Final    NO GROWTH 3  DAYS Performed at [facility], [address], [place], [place] [address]    Report Status PENDING  Incomplete  C Difficile Quick Screen w PCR reflex     Status: None   Collection Time: 04/19/24  2:27 AM   Specimen: Stool  Result Value Ref Range Status   C Diff antigen NEGATIVE NEGATIVE Final   C Diff toxin NEGATIVE NEGATIVE Final   C Diff interpretation No C. difficile detected.  Final    Comment: Performed at [facility], [address] [place], [place] [address]  Gastrointestinal Panel by PCR , Stool     Status: Abnormal   Collection Time: 04/19/24  2:27 AM   Specimen: Stool  Result Value Ref Range Status   Campylobacter species NOT DETECTED NOT DETECTED Final   Plesimonas shigelloides NOT DETECTED NOT DETECTED Final   Salmonella species DETECTED (A) NOT DETECTED Final    Comment: RESULT CALLED TO, READ BACK BY AND VERIFIED WITH: MARSHA ADA MD AT 1114 04/19/2024 [facility]    Yersinia enterocolitica NOT DETECTED NOT DETECTED Final   Vibrio species NOT DETECTED NOT DETECTED Final   Vibrio cholerae NOT DETECTED NOT DETECTED Final   Enteroaggregative E coli (EAEC) NOT DETECTED NOT DETECTED Final   Enteropathogenic E coli (EPEC) NOT DETECTED NOT DETECTED Final   Enterotoxigenic E coli (ETEC) NOT DETECTED NOT DETECTED Final   Shiga like toxin producing E coli (STEC) NOT DETECTED NOT DETECTED Final   Shigella/Enteroinvasive E coli (EIEC) NOT DETECTED NOT DETECTED Final   Cryptosporidium NOT DETECTED NOT DETECTED Final   Cyclospora cayetanensis NOT DETECTED NOT DETECTED Final   Entamoeba histolytica NOT DETECTED NOT DETECTED Final   Giardia lamblia NOT DETECTED NOT DETECTED Final   Adenovirus  F40/41 NOT DETECTED NOT DETECTED Final   Astrovirus NOT DETECTED NOT DETECTED Final   Norovirus GI/GII NOT DETECTED NOT DETECTED Final   Rotavirus A NOT DETECTED NOT DETECTED Final   Sapovirus (I, II, IV, and V) NOT DETECTED NOT DETECTED Final    Comment: Performed at [facility], [address] [place], [place] [address]      Radiology Studies: No results found.      LOS: 3 days   Darcy Barbara [employer] on [email or web address]  04/22/2024, 10:01 AM   "

## 2024-04-22 NOTE — Progress Notes (Signed)
"  °  Progress Note  Patient Name: Erin Ward Date of Encounter: 04/22/2024 [facility] Cardiologist: Erin Cash, MD   Interval Summary   Continue to have rapid episodes of atrial fibrillation.  No acute complaints at this time other than mild palpitations.  Vital Signs Vitals:   04/21/24 2335 04/22/24 0234 04/22/24 0451 04/22/24 0818  BP: (!) 140/87 (!) 148/90  (!) 134/92  Pulse: (!) 112 (!) 103 (!) 103 (!) 130  Resp: 20 20 20 20   Temp: 98.5 F (36.9 C)  (!) 100.4 F (38 C) 98.8 F (37.1 C)  TempSrc: Oral  Oral Oral  SpO2: 96%  97% 92%  Weight:      Height:        Intake/Output Summary (Last 24 hours) at 04/22/2024 0902 Last data filed at 04/21/2024 1805 Gross per 24 hour  Intake 255.87 ml  Output --  Net 255.87 ml      04/19/2024   10:01 AM 05/17/2023    8:08 AM 04/06/2022    8:32 AM  Last 3 Weights  Weight (lbs) 169 lb 12.1 oz 169 lb 12.8 oz 170 lb 6.4 oz  Weight (kg) 77 kg 77.021 kg 77.293 kg      Telemetry/ECG  Atrial fibrillation- Personally Reviewed  Physical Exam  GEN: No acute distress.   Neck: No JVD Cardiac: Tachycardic, irregular Respiratory: Clear to auscultation bilaterally. GI: Soft, nontender, non-distended  MS: No edema  Assessment & Plan   1.  Paroxysmal atrial fibrillation/flutter: Has occurred in the setting of Salmonella infection.  She is continue to have paroxysms of atrial fibrillation that are quite rapid.  Her rates are increased compared to yesterday.  [name] start IV amiodarone .  [name] plan for sirolimus  level tomorrow.  This is a send out lab.  [name].  She is also on Eliquis  which can affect her sirolimus  levels.   For questions or updates, please contact [facility] Please consult [email or web address] for contact info under         Signed, Erin Hebert Gladis Norton, MD   "

## 2024-04-22 NOTE — Progress Notes (Signed)
 SABRA

## 2024-04-23 ENCOUNTER — Inpatient Hospital Stay (HOSPITAL_COMMUNITY)

## 2024-04-23 DIAGNOSIS — Z940 Kidney transplant status: Secondary | ICD-10-CM | POA: Diagnosis not present

## 2024-04-23 DIAGNOSIS — I4891 Unspecified atrial fibrillation: Secondary | ICD-10-CM | POA: Diagnosis not present

## 2024-04-23 DIAGNOSIS — A020 Salmonella enteritis: Secondary | ICD-10-CM | POA: Diagnosis not present

## 2024-04-23 LAB — URINALYSIS, W/ REFLEX TO CULTURE (INFECTION SUSPECTED)
Bilirubin Urine: NEGATIVE
Glucose, UA: NEGATIVE mg/dL
Hgb urine dipstick: NEGATIVE
Ketones, ur: 5 mg/dL — AB
Leukocytes,Ua: NEGATIVE
Nitrite: NEGATIVE
Protein, ur: NEGATIVE mg/dL
Specific Gravity, Urine: 1.014 (ref 1.005–1.030)
pH: 7 (ref 5.0–8.0)

## 2024-04-23 LAB — BASIC METABOLIC PANEL WITH GFR
Anion gap: 10 (ref 5–15)
BUN: 10 mg/dL (ref 8–23)
CO2: 26 mmol/L (ref 22–32)
Calcium: 9 mg/dL (ref 8.9–10.3)
Chloride: 99 mmol/L (ref 98–111)
Creatinine, Ser: 0.71 mg/dL (ref 0.44–1.00)
GFR, Estimated: >60 mL/min
Glucose, Bld: 98 mg/dL (ref 70–99)
Potassium: 3.9 mmol/L (ref 3.5–5.1)
Sodium: 135 mmol/L (ref 135–145)

## 2024-04-23 LAB — CBC
HCT: 31 % — ABNORMAL LOW (ref 36.0–46.0)
Hemoglobin: 9.7 g/dL — ABNORMAL LOW (ref 12.0–15.0)
MCH: 22.2 pg — ABNORMAL LOW (ref 26.0–34.0)
MCHC: 31.3 g/dL (ref 30.0–36.0)
MCV: 71.1 fL — ABNORMAL LOW (ref 80.0–100.0)
Platelets: 481 K/uL — ABNORMAL HIGH (ref 150–400)
RBC: 4.36 MIL/uL (ref 3.87–5.11)
RDW: 17.1 % — ABNORMAL HIGH (ref 11.5–15.5)
WBC: 14.9 K/uL — ABNORMAL HIGH (ref 4.0–10.5)
nRBC: 0 % (ref 0.0–0.2)

## 2024-04-23 LAB — MAGNESIUM: Magnesium: 1.9 mg/dL (ref 1.7–2.4)

## 2024-04-23 MED ORDER — POTASSIUM CHLORIDE CRYS ER 20 MEQ PO TBCR
40.0000 meq | EXTENDED_RELEASE_TABLET | Freq: Once | ORAL | Status: AC
  Administered 2024-04-23: 40 meq via ORAL
  Filled 2024-04-23: qty 2

## 2024-04-23 MED ORDER — DILTIAZEM HCL-DEXTROSE 125-5 MG/125ML-% IV SOLN (PREMIX)
5.0000 mg/h | INTRAVENOUS | Status: AC
  Administered 2024-04-24: 5 mg/h via INTRAVENOUS
  Filled 2024-04-23: qty 125

## 2024-04-23 MED ORDER — GUAIFENESIN ER 600 MG PO TB12
600.0000 mg | ORAL_TABLET | Freq: Two times a day (BID) | ORAL | Status: DC
  Administered 2024-04-23 – 2024-04-24 (×3): 600 mg via ORAL
  Filled 2024-04-23 (×3): qty 1

## 2024-04-23 MED ORDER — DILTIAZEM HCL-DEXTROSE 125-5 MG/125ML-% IV SOLN (PREMIX)
5.0000 mg/h | INTRAVENOUS | Status: DC
  Administered 2024-04-23: 5 mg/h via INTRAVENOUS
  Filled 2024-04-23: qty 125

## 2024-04-23 NOTE — Progress Notes (Signed)
 "  [facility] PROGRESS NOTE   Erin Ward FMW:[id number] DOB: 1960/09/29 DOA: 04/19/2024  PCP: [name], MD  Brief History:  64 y.o. 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  Patient was hospitalized for further management.   Consultants: Cardiology  Procedures: Echocardiogram  Subjective/Interval History: Patient mentioned that she is feeling better this morning.  Still has some sore throat but better than yesterday.  Has noticed occasional cough.  Denies any chest pain or difficulty breathing.  Husband and daughter at bedside.     Assessment/Plan:  Salmonella enteritis/colitis Patient came in with diarrhea.  C. difficile is negative.  GI pathogen panel positive for Salmonella species. Patient was started on ceftriaxone .  Diarrhea appears to have subsided.  Probably needs only 5 days of antibiotics for her enteritis but due to other issues as discussed below we will continue ceftriaxone  for now.  Hypoxia/low-grade fever/leukocytosis Noted to have leukocytosis of unclear etiology.  Also had low-grade fever of unclear etiology.  COVID-19 influenza RSV was negative.  Strep screen was negative.  UA did not suggest infection. Chest x-ray shows hazy basilar opacity in the left lung.  Continue ceftriaxone  for now.  Incentive spirometry.  Did have drop in saturation to 89% overnight.  Currently on 2 L of oxygen by nasal cannula.  Atrial fibrillation with RVR Appears to be a new diagnosis for her.  This is in the setting of her GI illness. TSH noted to be 0.275.  Free T4 is normal at 0.97.  Likely sick euthyroid. Echocardiogram shows normal LVEF.   Patient seems to be going in and out of atrial fibrillation.   Electrophysiology is  following and managing. Patient noted to be on amiodarone  infusion.  Started on Cardizem  infusion this morning.  Patient also on oral metoprolol . Anticoagulated with apixaban .    Acute kidney injury/history of renal transplant Patient underwent transplant at [facility].  She is followed by local nephrology. Creatinine was 1.7 at presentation.  BUN was also elevated.  Care Everywhere was reviewed.  Creatinine was noted to be 0.97 in August. This was likely acute kidney injury due to GI loss. Patient's renal function is back to baseline with IV hydration.  IV fluids were discontinued. She is back on her transplant medications.  She is noted to be on CellCept , sirolimus  and prednisone . Sirolimus  level sent by cardiology. Renal function is stable.  Microcytic anemia Significant drop in hemoglobin noted which is likely dilutional.  Patient did see streaks of blood in her stool but no profuse bleeding has been noted.  Based on Care Everywhere patient's hemoglobin was 11.5 in August. Anemia panel was done and shows ferritin of 53, iron 29, TIBC 276, percent saturation 10.  Folic acid 20.  Vitamin B12 947. Hemoglobin is low but stable.  No overt bleeding noted.  Essential hypertension Monitor blood pressures closely.   DVT Prophylaxis: On Eliquis  Code Status: Full code Family Communication: Discussed with patient and her daughter Disposition Plan: Hopefully return home when cleared by cardiology    Medications: Scheduled:  apixaban   5 mg Oral BID   loratadine   10 mg Oral Daily   magnesium  oxide  400 mg Oral QHS   metoprolol  tartrate  100 mg Oral BID   multivitamin with minerals  1  tablet Oral Daily   mycophenolate   500 mg Oral BID   pantoprazole   40 mg Oral Daily   predniSONE   5 mg Oral Daily   psyllium  1 packet Oral Daily   rosuvastatin   5 mg Oral Q24H   Sirolimus   0.5 mg Oral q AM   venlafaxine  XR  150 mg Oral Q breakfast   Continuous:  amiodarone  30 mg/hr (04/22/24 2231)    cefTRIAXone  (ROCEPHIN )  IV 200 mL/hr at 04/22/24 1450   diltiazem  (CARDIZEM ) infusion 5 mg/hr (04/23/24 0836)   PRN:acetaminophen , Gerhardt's butt cream, ipratropium-albuterol , menthol , ondansetron  (ZOFRAN ) IV  Antibiotics: Anti-infectives (From admission, onward)    Start     Dose/Rate Route Frequency Ordered Stop   04/19/24 1315  cefTRIAXone  (ROCEPHIN ) 2 g in sodium chloride  0.9 % 100 mL IVPB        2 g 200 mL/hr over 30 Minutes Intravenous Every 24 hours 04/19/24 1302     04/19/24 1100  piperacillin -tazobactam (ZOSYN ) IVPB 3.375 g  Status:  Discontinued        3.375 g 12.5 mL/hr over 240 Minutes Intravenous Every 8 hours 04/19/24 1014 04/19/24 1302   04/19/24 0230  piperacillin -tazobactam (ZOSYN ) IVPB 3.375 g        3.375 g 100 mL/hr over 30 Minutes Intravenous  Once 04/19/24 0227 04/19/24 0352       Objective:  Vital Signs  Vitals:   04/23/24 0042 04/23/24 0049 04/23/24 0300 04/23/24 0848  BP: (!) 132/101  (!) 133/94 117/82  Pulse: (!) 129 (!) 125 (!) 108 (!) 118  Resp: 20  20 18   Temp: 99.2 F (37.3 C)  98.7 F (37.1 C)   TempSrc: Oral  Oral   SpO2: (!) 89% 94% 97% 98%  Weight:      Height:        Intake/Output Summary (Last 24 hours) at 04/23/2024 0956 Last data filed at 04/23/2024 0600 Gross per 24 hour  Intake 955.7 ml  Output 1350 ml  Net -394.3 ml   Filed Weights   04/19/24 1001  Weight: 77 kg    General appearance: Awake alert.  In no distress Resp: No tachypnea noted.  Diminished air entry at the bases with few crackles.  No wheezing or rhonchi. Cardio: S1-S2 is irregularly irregular, tachycardic  GI: Abdomen is soft.  Nontender nondistended.  Bowel sounds are present normal.  No masses organomegaly Extremities: No edema.  Full range of motion of lower extremities. Neurologic: Alert and oriented x3.  No focal neurological deficits.     Lab Results:  Data Reviewed: I have personally reviewed following labs and reports of the imaging  studies  CBC: Recent Labs  Lab 04/19/24 0158 04/20/24 0639 04/21/24 0006 04/21/24 1421 04/22/24 0545 04/23/24 0441  WBC 15.6* 10.3 9.0  --  13.7* 14.9*  NEUTROABS 12.5*  --   --   --   --   --   HGB 12.4 9.4* 9.4* 9.5* 9.6* 9.7*  HCT 40.0 29.8* 29.8* 30.6* 30.1* 31.0*  MCV 72.6* 70.8* 71.6*  --  71.3* 71.1*  PLT 566* 450* 480*  --  484* 481*    Basic Metabolic Panel: Recent Labs  Lab 04/19/24 0158 04/20/24 0639 04/21/24 0006 04/22/24 0545 04/23/24 0441  NA 132* 137 137 137 135  K 3.9 3.7 3.9 3.9 3.9  CL 98 104 104 101 99  CO2 20* 20* 21* 24 26  GLUCOSE 126* 155* 158* 104* 98  BUN 27* 28* 26* 14 10  CREATININE 1.70*  0.97 0.89 0.78 0.71  CALCIUM  8.9 8.3* 8.4* 8.7* 9.0  MG 1.9  --   --  2.0 1.9    GFR: Estimated Creatinine Clearance: 74.1 mL/min (by C-G formula based on SCr of 0.71 mg/dL).  Liver Function Tests: Recent Labs  Lab 04/19/24 0158  AST 35  ALT 40  ALKPHOS 96  BILITOT 0.4  PROT 6.7  ALBUMIN 3.7    Coagulation Profile: Recent Labs  Lab 04/19/24 0158  INR 1.1    BNP (last 3 results) Recent Labs    04/19/24 0158  PROBNP 1,855.0*     Recent Results (from the past 240 hours)  Blood Culture (routine x 2)     Status: None (Preliminary result)   Collection Time: 04/19/24  1:20 AM   Specimen: BLOOD  Result Value Ref Range Status   Specimen Description BLOOD RIGHT ANTECUBITAL  Final   Special Requests   Final    BOTTLES DRAWN AEROBIC ONLY Blood Culture adequate volume   Culture   Final    NO GROWTH 4 DAYS Performed at [facility], [address] [place], [place] [address]    Report Status PENDING  Incomplete  Blood Culture (routine x 2)     Status: None (Preliminary result)   Collection Time: 04/19/24  1:25 AM   Specimen: BLOOD  Result Value Ref Range Status   Specimen Description BLOOD SITE NOT SPECIFIED  Final   Special Requests   Final    BOTTLES DRAWN AEROBIC AND ANAEROBIC Blood Culture adequate volume   Culture   Final     NO GROWTH 4 DAYS Performed at [facility], [address], [place], [place] [address]    Report Status PENDING  Incomplete  C Difficile Quick Screen w PCR reflex     Status: None   Collection Time: 04/19/24  2:27 AM   Specimen: Stool  Result Value Ref Range Status   C Diff antigen NEGATIVE NEGATIVE Final   C Diff toxin NEGATIVE NEGATIVE Final   C Diff interpretation No C. difficile detected.  Final    Comment: Performed at [facility], [address] [place], [place] [address]  Gastrointestinal Panel by PCR , Stool     Status: Abnormal   Collection Time: 04/19/24  2:27 AM   Specimen: Stool  Result Value Ref Range Status   Campylobacter species NOT DETECTED NOT DETECTED Final   Plesimonas shigelloides NOT DETECTED NOT DETECTED Final   Salmonella species DETECTED (A) NOT DETECTED Final    Comment: RESULT CALLED TO, READ BACK BY AND VERIFIED WITH: MARSHA ADA MD AT 1114 04/19/2024 DLB    Yersinia enterocolitica NOT DETECTED NOT DETECTED Final   Vibrio species NOT DETECTED NOT DETECTED Final   Vibrio cholerae NOT DETECTED NOT DETECTED Final   Enteroaggregative E coli (EAEC) NOT DETECTED NOT DETECTED Final   Enteropathogenic E coli (EPEC) NOT DETECTED NOT DETECTED Final   Enterotoxigenic E coli (ETEC) NOT DETECTED NOT DETECTED Final   Shiga like toxin producing E coli (STEC) NOT DETECTED NOT DETECTED Final   Shigella/Enteroinvasive E coli (EIEC) NOT DETECTED NOT DETECTED Final   Cryptosporidium NOT DETECTED NOT DETECTED Final   Cyclospora cayetanensis NOT DETECTED NOT DETECTED Final   Entamoeba histolytica NOT DETECTED NOT DETECTED Final   Giardia lamblia NOT DETECTED NOT DETECTED Final   Adenovirus F40/41 NOT DETECTED NOT DETECTED Final   Astrovirus NOT DETECTED NOT DETECTED Final   Norovirus GI/GII NOT DETECTED NOT DETECTED Final  Rotavirus A NOT DETECTED NOT DETECTED Final   Sapovirus (I, II, IV, and V) NOT DETECTED NOT DETECTED Final    Comment: Performed at  [facility], [address] [place], [place] [address]  Resp panel by RT-PCR (RSV, Flu A&B, Covid) Anterior Nasal Swab     Status: None   Collection Time: 04/22/24  9:11 AM   Specimen: Anterior Nasal Swab  Result Value Ref Range Status   SARS Coronavirus 2 by RT PCR NEGATIVE NEGATIVE Final   Influenza A by PCR NEGATIVE NEGATIVE Final   Influenza B by PCR NEGATIVE NEGATIVE Final    Comment: (NOTE) The Xpert Xpress SARS-CoV-2/FLU/RSV plus assay is intended as an aid in the diagnosis of influenza from Nasopharyngeal swab specimens and should not be used as a sole basis for treatment. Nasal washings and aspirates are unacceptable for Xpert Xpress SARS-CoV-2/FLU/RSV testing.  Fact Sheet for Patients: bloggercourse.com  Fact Sheet for Healthcare Providers: seriousbroker.it  This test is not yet approved or cleared by the [place]  FDA and has been authorized for detection and/or diagnosis of SARS-CoV-2 by FDA under an Emergency Use Authorization (EUA). This EUA will remain in effect (meaning this test can be used) for the duration of the COVID-19 declaration under Section 564(b)(1) of the Act, 21 U.S.C. section 360bbb-3(b)(1), unless the authorization is terminated or revoked.     Resp Syncytial Virus by PCR NEGATIVE NEGATIVE Final    Comment: (NOTE) Fact Sheet for Patients: bloggercourse.com  Fact Sheet for Healthcare Providers: seriousbroker.it  This test is not yet approved or cleared by the [place]  FDA and has been authorized for detection and/or diagnosis of SARS-CoV-2 by FDA under an Emergency Use Authorization (EUA). This EUA will remain in effect (meaning this test can be used) for the duration of the COVID-19 declaration under Section 564(b)(1) of the Act, 21 U.S.C. section 360bbb-3(b)(1), unless the authorization is terminated or revoked.  Performed  at [facility], [address] [place], [place] [address]   Group A Strep by PCR     Status: None   Collection Time: 04/22/24 10:03 AM   Specimen: Anterior Nasal Swab; Sterile Swab  Result Value Ref Range Status   Group A Strep by PCR NOT DETECTED NOT DETECTED Final    Comment: Performed at [facility], [address] [place], [place] [address]      Radiology Studies: DG CHEST PORT 1 VIEW Result Date: 04/23/2024 EXAM: 1 VIEW(S) XRAY OF THE CHEST 04/23/2024 09:27:00 AM COMPARISON: 04/19/2024 CLINICAL HISTORY: Cough FINDINGS: LUNGS AND PLEURA: Hazy left basilar opacities. No pleural effusion. No pneumothorax. HEART AND MEDIASTINUM: Atherosclerotic plaque. No acute abnormality of the cardiac and mediastinal silhouettes. BONES AND SOFT TISSUES: No acute osseous abnormality. IMPRESSION: 1. Hazy left basilar opacities. Electronically signed by: Morgane Naveau MD 04/23/2024 09:40 AM EST RP Workstation: [id number]        LOS: 4 days   Saher Davee  [facility] Pager on [email or web address]  04/23/2024, 9:56 AM   "

## 2024-04-23 NOTE — Progress Notes (Signed)
 Patient currently maintaining NSR 70s-80s.  Will continue to monitor closely.

## 2024-04-23 NOTE — Progress Notes (Signed)
"  °  Progress Note  Patient Name: Erin Ward Date of Encounter: 04/23/2024 [facility] Cardiologist: Erin Cash, MD   Interval Summary   No acute complaints.  Continues to be in and out of atrial fibrillation/flutter overnight.  Vital Signs Vitals:   04/22/24 2059 04/23/24 0042 04/23/24 0049 04/23/24 0300  BP: (!) 143/97 (!) 132/101  (!) 133/94  Pulse: (!) 159 (!) 129 (!) 125 (!) 108  Resp: 18 20  20   Temp: 98.7 F (37.1 C) 99.2 F (37.3 C)  98.7 F (37.1 C)  TempSrc: Axillary Oral  Oral  SpO2: 92% (!) 89% 94% 97%  Weight:      Height:        Intake/Output Summary (Last 24 hours) at 04/23/2024 0754 Last data filed at 04/23/2024 0600 Gross per 24 hour  Intake 955.7 ml  Output 1350 ml  Net -394.3 ml      04/19/2024   10:01 AM 05/17/2023    8:08 AM 04/06/2022    8:32 AM  Last 3 Weights  Weight (lbs) 169 lb 12.1 oz 169 lb 12.8 oz 170 lb 6.4 oz  Weight (kg) 77 kg 77.021 kg 77.293 kg      Telemetry/ECG  Atrial fibrillation/flutter- Personally Reviewed  Physical Exam  GEN: No acute distress.   Neck: No JVD Cardiac: Irregular, tachycardic   Respiratory: Clear to auscultation bilaterally. GI: Soft, nontender, non-distended  MS: No edema  Assessment & Plan   1.  Paroxysmal atrial fibrillation/flutter: Occurred in the setting of Salmonella infection.  He is continue.  Both atrial fibrillation and atrial flutter.  She is on IV amiodarone  and metoprolol .  Her heart rates are still poorly controlled.  Erin Ward start IV diltiazem .  She is on Eliquis .  Both amiodarone  and Eliquis  can affect her levels.  Further plan for these medications per pharmacy.  Erin Ward check sirolimus  levels today.  Hopefully she can be seen by outpatient nephrology to have these further adjusted.  In the long-term, if she continues to have episodes of atrial fibrillation, ablation would be a reasonable option.   For questions or updates, please contact  [facility] Please consult  [email or web address] for contact info under         Signed, Erin Basnett Gladis Norton, MD   "

## 2024-04-24 ENCOUNTER — Telehealth (HOSPITAL_COMMUNITY): Payer: Self-pay | Admitting: Pharmacy Technician

## 2024-04-24 ENCOUNTER — Other Ambulatory Visit (HOSPITAL_COMMUNITY): Payer: Self-pay

## 2024-04-24 ENCOUNTER — Telehealth (INDEPENDENT_AMBULATORY_CARE_PROVIDER_SITE_OTHER): Payer: Self-pay | Admitting: Audiology

## 2024-04-24 LAB — CBC
HCT: 31.6 % — ABNORMAL LOW (ref 36.0–46.0)
Hemoglobin: 9.8 g/dL — ABNORMAL LOW (ref 12.0–15.0)
MCH: 22.3 pg — ABNORMAL LOW (ref 26.0–34.0)
MCHC: 31 g/dL (ref 30.0–36.0)
MCV: 72 fL — ABNORMAL LOW (ref 80.0–100.0)
Platelets: 500 K/uL — ABNORMAL HIGH (ref 150–400)
RBC: 4.39 MIL/uL (ref 3.87–5.11)
RDW: 17.2 % — ABNORMAL HIGH (ref 11.5–15.5)
WBC: 9 K/uL (ref 4.0–10.5)
nRBC: 0 % (ref 0.0–0.2)

## 2024-04-24 LAB — CULTURE, BLOOD (ROUTINE X 2)
Culture: NO GROWTH
Culture: NO GROWTH
Special Requests: ADEQUATE
Special Requests: ADEQUATE

## 2024-04-24 LAB — BASIC METABOLIC PANEL WITH GFR
Anion gap: 11 (ref 5–15)
BUN: 13 mg/dL (ref 8–23)
CO2: 26 mmol/L (ref 22–32)
Calcium: 9.1 mg/dL (ref 8.9–10.3)
Chloride: 103 mmol/L (ref 98–111)
Creatinine, Ser: 0.81 mg/dL (ref 0.44–1.00)
GFR, Estimated: >60 mL/min
Glucose, Bld: 96 mg/dL (ref 70–99)
Potassium: 4.1 mmol/L (ref 3.5–5.1)
Sodium: 140 mmol/L (ref 135–145)

## 2024-04-24 LAB — MAGNESIUM: Magnesium: 1.9 mg/dL (ref 1.7–2.4)

## 2024-04-24 LAB — PROCALCITONIN: Procalcitonin: 0.13 ng/mL

## 2024-04-24 MED ORDER — GUAIFENESIN ER 600 MG PO TB12
600.0000 mg | ORAL_TABLET | Freq: Two times a day (BID) | ORAL | 0 refills | Status: AC | PRN
  Filled 2024-04-24: qty 30, 15d supply, fill #0

## 2024-04-24 MED ORDER — METOPROLOL TARTRATE 50 MG PO TABS
50.0000 mg | ORAL_TABLET | Freq: Two times a day (BID) | ORAL | Status: DC
  Administered 2024-04-24: 50 mg via ORAL
  Filled 2024-04-24: qty 1

## 2024-04-24 MED ORDER — DILTIAZEM HCL ER COATED BEADS 180 MG PO CP24
180.0000 mg | ORAL_CAPSULE | Freq: Every day | ORAL | Status: DC
  Administered 2024-04-24: 180 mg via ORAL
  Filled 2024-04-24: qty 1

## 2024-04-24 MED ORDER — AMIODARONE HCL 200 MG PO TABS: 200.0000 mg | ORAL_TABLET | Freq: Every day | ORAL | Status: DC

## 2024-04-24 MED ORDER — METOPROLOL TARTRATE 50 MG PO TABS
50.0000 mg | ORAL_TABLET | Freq: Two times a day (BID) | ORAL | 6 refills | Status: AC
  Filled 2024-04-24: qty 60, 30d supply, fill #0

## 2024-04-24 MED ORDER — OFF THE BEAT BOOK
Freq: Once | Status: AC
  Filled 2024-04-24: qty 1

## 2024-04-24 MED ORDER — AMIODARONE HCL 200 MG PO TABS: 200.0000 mg | ORAL_TABLET | Freq: Two times a day (BID) | ORAL | Status: DC

## 2024-04-24 MED ORDER — CEFDINIR 300 MG PO CAPS
300.0000 mg | ORAL_CAPSULE | Freq: Two times a day (BID) | ORAL | 0 refills | Status: AC
  Filled 2024-04-24: qty 10, 5d supply, fill #0

## 2024-04-24 MED ORDER — AMIODARONE HCL 200 MG PO TABS
ORAL_TABLET | ORAL | 6 refills | Status: AC
  Filled 2024-04-24: qty 60, 32d supply, fill #0

## 2024-04-24 MED ORDER — DILTIAZEM HCL ER COATED BEADS 180 MG PO CP24
180.0000 mg | ORAL_CAPSULE | Freq: Every day | ORAL | 2 refills | Status: AC
  Filled 2024-04-24: qty 90, 90d supply, fill #0

## 2024-04-24 MED ORDER — AMIODARONE HCL 200 MG PO TABS
400.0000 mg | ORAL_TABLET | Freq: Two times a day (BID) | ORAL | Status: DC
  Administered 2024-04-24: 400 mg via ORAL
  Filled 2024-04-24: qty 2

## 2024-04-24 MED ORDER — APIXABAN 5 MG PO TABS
5.0000 mg | ORAL_TABLET | Freq: Two times a day (BID) | ORAL | 0 refills | Status: AC
  Filled 2024-04-24: qty 60, 30d supply, fill #0

## 2024-04-24 NOTE — Telephone Encounter (Signed)
 Patient called to check on the status of her hearing aid repair.  [employer] replaced the patient's hearing aids and hearing print production planner.  They are in office and ready for pickup.  Since they are under warranty, there is $0 due at this time.  I called and spoke with the patient and explained the hearing aids and charger were replaced.  Hearing aids have been programmed with the last settings available.  Patient opted to pick up the hearing aids and try them out at home.  If for any reason, she is not happy with how they sound, she will call us  and schedule a Hearing Aid check appointment.  Patient stated her husband, Erin Ward, may be picking the hearing aids up for her.

## 2024-04-24 NOTE — Progress Notes (Signed)
 Chart reviewed - Seen by EP . No new recs

## 2024-04-24 NOTE — Progress Notes (Addendum)
 "  Patient Name: Erin Ward Date of Encounter: 04/24/2024  Primary Cardiologist: Lonni Cash, MD Electrophysiologist: None  Interval Summary   The patient is doing well today. Sitting up in chair.  Hopeful to go home.  At this time, the patient denies chest pain, shortness of breath, or any new concerns.  Vital Signs    Vitals:   04/23/24 1647 04/23/24 2010 04/23/24 2320 04/24/24 0435  BP: 117/73 125/77 135/79 (!) 143/80  Pulse: 68 93 (!) 59 61  Resp: 17 19 17 16   Temp: 98.6 F (37 C) 98.6 F (37 C) 98.3 F (36.8 C) 98.6 F (37 C)  TempSrc: Oral Oral Oral Oral  SpO2: 98% 97%  97%  Weight:      Height:        Intake/Output Summary (Last 24 hours) at 04/24/2024 0727 Last data filed at 04/24/2024 9353 Gross per 24 hour  Intake 519.5 ml  Output 1850 ml  Net -1330.5 ml   Filed Weights   04/19/24 1001  Weight: 77 kg    Physical Exam    GEN- NAD, Alert and oriented  Lungs- Clear to ausculation bilaterally, normal work of breathing Cardiac- Regular rate and rhythm, no murmurs, rubs or gallops GI- soft, NT, ND, + BS Extremities- no clubbing or cyanosis. No edema  Telemetry    Largely SR 60-70's, brief episode of AF for one hour overnight with controlled V rates, converted back to SR (personally reviewed)  Hospital Course    Erin Ward is a 64 y.o. female with PMH of HFrecEF, HTN, ESRD s/p renal transplant (follows and [facility] and with Dr. Gearline locally), asthma admitted for diarrhea, fatigue and hypotension. Found to have colitis on CT abd/pelvis, lab positive for salmonella.  She developed AFwRVR in setting of GI illness.    Assessment & Plan    Atrial Fibrillation with RVR  In setting of GI illness, volume depletion. New dx this admission. ECHO with LVEF 55-60%.  -transition to PO amiodarone  taper > 400 mg BID x7 days, then 200 mg BID x7 days, then 200 mg daily  -transition to PO cardizem  120 mg daily  -reduce Metoprolol  to 50 mg BID  -plan for f/u with  EP APP at discharge > Charlies Arthur, PA-C 05/30/24 at 0940 -consider ablation candidacy in future once recovered from illness  -sirolimus  level pending   Secondary Hypercoagulable State  -Eliquis  5mg  BID for stroke prophylaxis, appropriate dosing by wt / Cr  Salmonella Enteritis / Colitis  Fever, Leukocytosis  -per [facility]      From EP standpoint, she can go home later this afternoon (~2-3PM) once she has transitioned off IV agents and HR remains stable.     For questions or updates, please contact [facility] Please consult [email or web address] for contact info under     Signed, Daphne Barrack, NP-C, AGACNP-BC [facility] - Electrophysiology  04/24/2024, 7:27 AM   I have seen and examined this patient with Daphne Barrack.  Agree with above, note added to reflect my findings.  Patient feeling well.  No acute complaints at this time.  Has converted to sinus rhythm.  GEN: No acute distress.   Neck: No JVD Cardiac: RRR, no murmurs, rubs, or gallops.  Respiratory: normal BS bases bilaterally. GI: Soft, nontender, non-distended  MS: No edema; No deformity. Neuro:  Nonfocal  Skin: warm and dry Psych: Normal affect    Paroxysmal atrial fibrillation/flutter with rapid ventricular response: Patient has converted to sinus rhythm.  She  has had sinus rhythm since yesterday with only short episodes of atrial fibrillation.  For now, we Vicenta Olds transition to p.o. amiodarone  and Cardizem  as above.  Would reduce metoprolol  as above.  We Lexie Koehl arrange for follow-up in clinic.  When she has recovered from her acute illness, she may be amenable to ablation for her atrial fibrillation. Secondary hypercoagulable date: On Eliquis  Salmonella enteritis/colitis: Per hospitalist team  Patient is on sirolimus .  Their interactions with sirolimus , amiodarone  and Eliquis .  A sirolimus  level is pending.  This should be followed by her outpatient nephrologist and transplant nephrology at [facility].  Rowdy Guerrini M.  Jennea Rager MD 04/24/2024 9:04 AM  "

## 2024-04-24 NOTE — Telephone Encounter (Signed)
 Patient Product/process Development Scientist completed.    The patient is insured through [facility]. Patient has [employer], may use a copay card, and/or apply for patient assistance if available.    Ran test claim for Eliquis  5 mg and the current 30 day co-pay is $50.00.   This test claim was processed through [facility]- copay amounts may vary at other pharmacies due to pharmacy/plan contracts, or as the patient moves through the different stages of their insurance plan.     Reyes Sharps, CPHT Pharmacy Technician Patient Advocate Specialist Lead [facility] Patient Advocate Team Direct Number: [phone]  Fax: [phone]

## 2024-04-24 NOTE — Progress Notes (Signed)
 PT Cancellation Note  Patient Details Name: Erin Ward MRN: [id number] DOB: 03-03-1961   Cancelled Treatment:    Reason Eval/Treat Not Completed: PT screened, no needs identified, will sign off. Patient and RN report pt indep, ambulating hallways, no questions or concerns.  Erin Ward, PT, DPT Acute Rehabilitation Services  Personal: Secure Chat Rehab Office: [phone]  Erin Ward L Erin Ward 04/24/2024, 8:45 AM

## 2024-04-24 NOTE — Discharge Summary (Signed)
 " [facility]  Physician Discharge Summary   Patient ID: Erin Ward MRN: [id number] DOB/AGE: [birth year] 61 y.o.  Admit date: 04/19/2024 Discharge date: 04/24/2024    PCP: [name], MD  DISCHARGE DIAGNOSES:    Atrial fibrillation with rapid ventricular response (HCC) Salmonella enteritis Acute kidney injury, resolved History of renal transplant Microcytic anemia Essential hypertension   RECOMMENDATIONS FOR OUTPATIENT FOLLOW UP: Outpatient follow-up with cardiology Patient to follow-up with renal transplant team  Home Health: None Equipment/Devices: None  CODE STATUS: Full code  DISCHARGE CONDITION: fair  Diet recommendation: As before  INITIAL HISTORY: 64 y.o. 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  Patient was hospitalized for further management.    Consultants: Cardiology   Procedures: Echocardiogram  HOSPITAL COURSE:   Salmonella enteritis/colitis Patient came in with diarrhea.  C. difficile was negative.  GI pathogen panel positive for Salmonella species. Patient was started on ceftriaxone .  Diarrhea appears to have subsided.  Completed course of antibiotics for the Salmonella.   Hypoxia/low-grade fever/leukocytosis/pneumonia Noted to have leukocytosis of unclear etiology.  Also had low-grade fever of unclear etiology.  COVID-19 influenza RSV was negative.  Strep screen was negative.  UA did not suggest infection. Chest x-ray shows hazy basilar opacity in the left lung.   She was already on ceftriaxone .  Will discharge her on Omnicef . Respiratory status has significantly improved.  She is now off of oxygen.     Atrial fibrillation with RVR Appears to be a new diagnosis for her.  This is in the  setting of her GI illness. TSH noted to be 0.275.  Free T4 is normal at 0.97.  Likely sick euthyroid. Echocardiogram shows normal LVEF.   Seen by cardiology.  Was treated with amiodarone  infusion and Cardizem .  Converted to sinus rhythm.  Okay for discharge.   Anticoagulated with apixaban .     Acute kidney injury/history of renal transplant Patient underwent transplant at [facility].  She is followed by local nephrology. Creatinine was 1.7 at presentation.  BUN was also elevated.  Care Everywhere was reviewed.  Creatinine was noted to be 0.97 in August. This was likely acute kidney injury due to GI loss. Patient's renal function is back to baseline with IV hydration.  She is back on her transplant medications.  She is noted to be on CellCept , sirolimus  and prednisone .   Microcytic anemia Significant drop in hemoglobin noted which is likely dilutional.  Patient did see streaks of blood in her stool but no profuse bleeding has been noted.  Based on Care Everywhere patient's hemoglobin was 11.5 in August. Anemia panel was done and shows ferritin of 53, iron 29, TIBC 276, percent saturation 10.  Folic acid 20.  Vitamin B12 947. Hemoglobin is low but stable.  No overt bleeding noted.   Essential hypertension Monitor blood pressures closely.    Patient is stable.  Okay for discharge home today.   PERTINENT LABS:  The results of significant diagnostics from this hospitalization (including imaging, microbiology, ancillary and laboratory) are listed below for reference.    Microbiology: Recent Results (from the past 240 hours)  Blood Culture (routine x 2)     Status: None   Collection Time: 04/19/24  1:20 AM   Specimen: BLOOD  Result Value Ref Range  Status   Specimen Description BLOOD RIGHT ANTECUBITAL  Final   Special Requests   Final    BOTTLES DRAWN AEROBIC ONLY Blood Culture adequate volume   Culture   Final    NO GROWTH 5 DAYS Performed at [facility], [address],  [place], [place] [address]    Report Status 04/24/2024 FINAL  Final  Blood Culture (routine x 2)     Status: None   Collection Time: 04/19/24  1:25 AM   Specimen: BLOOD  Result Value Ref Range Status   Specimen Description BLOOD SITE NOT SPECIFIED  Final   Special Requests   Final    BOTTLES DRAWN AEROBIC AND ANAEROBIC Blood Culture adequate volume   Culture   Final    NO GROWTH 5 DAYS Performed at [facility], [address] [place], [place] [address]    Report Status 04/24/2024 FINAL  Final  C Difficile Quick Screen w PCR reflex     Status: None   Collection Time: 04/19/24  2:27 AM   Specimen: Stool  Result Value Ref Range Status   C Diff antigen NEGATIVE NEGATIVE Final   C Diff toxin NEGATIVE NEGATIVE Final   C Diff interpretation No C. difficile detected.  Final    Comment: Performed at [facility], [address], [place], [place] [address]  Gastrointestinal Panel by PCR , Stool     Status: Abnormal   Collection Time: 04/19/24  2:27 AM   Specimen: Stool  Result Value Ref Range Status   Campylobacter species NOT DETECTED NOT DETECTED Final   Plesimonas shigelloides NOT DETECTED NOT DETECTED Final   Salmonella species DETECTED (A) NOT DETECTED Final    Comment: RESULT CALLED TO, READ BACK BY AND VERIFIED WITH: MARSHA ADA MD AT 1114 04/19/2024 DLB    Yersinia enterocolitica NOT DETECTED NOT DETECTED Final   Vibrio species NOT DETECTED NOT DETECTED Final   Vibrio cholerae NOT DETECTED NOT DETECTED Final   Enteroaggregative E coli (EAEC) NOT DETECTED NOT DETECTED Final   Enteropathogenic E coli (EPEC) NOT DETECTED NOT DETECTED Final   Enterotoxigenic E coli (ETEC) NOT DETECTED NOT DETECTED Final   Shiga like toxin producing E coli (STEC) NOT DETECTED NOT DETECTED Final   Shigella/Enteroinvasive E coli (EIEC) NOT DETECTED NOT DETECTED Final   Cryptosporidium NOT DETECTED NOT DETECTED Final   Cyclospora cayetanensis NOT DETECTED NOT DETECTED Final   Entamoeba  histolytica NOT DETECTED NOT DETECTED Final   Giardia lamblia NOT DETECTED NOT DETECTED Final   Adenovirus F40/41 NOT DETECTED NOT DETECTED Final   Astrovirus NOT DETECTED NOT DETECTED Final   Norovirus GI/GII NOT DETECTED NOT DETECTED Final   Rotavirus A NOT DETECTED NOT DETECTED Final   Sapovirus (I, II, IV, and V) NOT DETECTED NOT DETECTED Final    Comment: Performed at [facility], [address] [place], [place] [address]  Resp panel by RT-PCR (RSV, Flu A&B, Covid) Anterior Nasal Swab     Status: None   Collection Time: 04/22/24  9:11 AM   Specimen: Anterior Nasal Swab  Result Value Ref Range Status   SARS Coronavirus 2 by RT PCR NEGATIVE NEGATIVE Final   Influenza A by PCR NEGATIVE NEGATIVE Final   Influenza B by PCR NEGATIVE NEGATIVE Final    Comment: (NOTE) The Xpert Xpress SARS-CoV-2/FLU/RSV plus assay is intended as an aid in the diagnosis of influenza from Nasopharyngeal swab specimens and should not be used as a sole basis for  treatment. Nasal washings and aspirates are unacceptable for Xpert Xpress SARS-CoV-2/FLU/RSV testing.  Fact Sheet for Patients: bloggercourse.com  Fact Sheet for Healthcare Providers: seriousbroker.it  This test is not yet approved or cleared by the [place]  FDA and has been authorized for detection and/or diagnosis of SARS-CoV-2 by FDA under an Emergency Use Authorization (EUA). This EUA will remain in effect (meaning this test can be used) for the duration of the COVID-19 declaration under Section 564(b)(1) of the Act, 21 U.S.C. section 360bbb-3(b)(1), unless the authorization is terminated or revoked.     Resp Syncytial Virus by PCR NEGATIVE NEGATIVE Final    Comment: (NOTE) Fact Sheet for Patients: bloggercourse.com  Fact Sheet for Healthcare Providers: seriousbroker.it  This test is not yet approved or cleared by the  [place]  FDA and has been authorized for detection and/or diagnosis of SARS-CoV-2 by FDA under an Emergency Use Authorization (EUA). This EUA will remain in effect (meaning this test can be used) for the duration of the COVID-19 declaration under Section 564(b)(1) of the Act, 21 U.S.C. section 360bbb-3(b)(1), unless the authorization is terminated or revoked.  Performed at [facility], [address], [place], [place] [address]   Group A Strep by PCR     Status: None   Collection Time: 04/22/24 10:03 AM   Specimen: Anterior Nasal Swab; Sterile Swab  Result Value Ref Range Status   Group A Strep by PCR NOT DETECTED NOT DETECTED Final    Comment: Performed at [facility], [address], [place], [place] [address]     Labs:   Basic Metabolic Panel: Recent Labs  Lab 04/19/24 0158 04/20/24 9360 04/21/24 0006 04/22/24 0545 04/23/24 0441 04/24/24 0507  NA 132* 137 137 137 135 140  K 3.9 3.7 3.9 3.9 3.9 4.1  CL 98 104 104 101 99 103  CO2 20* 20* 21* 24 26 26   GLUCOSE 126* 155* 158* 104* 98 96  BUN 27* 28* 26* 14 10 13   CREATININE 1.70* 0.97 0.89 0.78 0.71 0.81  CALCIUM  8.9 8.3* 8.4* 8.7* 9.0 9.1  MG 1.9  --   --  2.0 1.9 1.9   Liver Function Tests: Recent Labs  Lab 04/19/24 0158  AST 35  ALT 40  ALKPHOS 96  BILITOT 0.4  PROT 6.7  ALBUMIN 3.7    CBC: Recent Labs  Lab 04/19/24 0158 04/20/24 0639 04/21/24 0006 04/21/24 1421 04/22/24 0545 04/23/24 0441 04/24/24 0507  WBC 15.6* 10.3 9.0  --  13.7* 14.9* 9.0  NEUTROABS 12.5*  --   --   --   --   --   --   HGB 12.4 9.4* 9.4* 9.5* 9.6* 9.7* 9.8*  HCT 40.0 29.8* 29.8* 30.6* 30.1* 31.0* 31.6*  MCV 72.6* 70.8* 71.6*  --  71.3* 71.1* 72.0*  PLT 566* 450* 480*  --  484* 481* 500*     ProBNP (last 3 results) Recent Labs    04/19/24 0158  PROBNP 1,855.0*    IMAGING STUDIES DG CHEST PORT 1 VIEW Result Date: 04/23/2024 EXAM: 1 VIEW(S) XRAY OF THE CHEST 04/23/2024 09:27:00 AM COMPARISON:  04/19/2024 CLINICAL HISTORY: Cough FINDINGS: LUNGS AND PLEURA: Hazy left basilar opacities. No pleural effusion. No pneumothorax. HEART AND MEDIASTINUM: Atherosclerotic plaque. No acute abnormality of the cardiac and mediastinal silhouettes. BONES AND SOFT TISSUES: No acute osseous abnormality. IMPRESSION: 1. Hazy left basilar opacities. Electronically signed by: Morgane Naveau MD 04/23/2024 09:40 AM EST RP Workstation: [id number]   ECHOCARDIOGRAM COMPLETE Result Date:  04/19/2024    ECHOCARDIOGRAM REPORT   Patient Name:   CARMELA PIECHOWSKI Date of Exam: 04/19/2024 Medical Rec #:  [id number]     Height:       64.0 in Accession #:    [id number]    Weight:       169.8 lb Date of Birth:  [birth year]     BSA:          1.825 m Patient Age:    63 years      BP:           87/59 mmHg Patient Gender: F             HR:           68 bpm. Exam Location:  Inpatient Procedure: 2D Echo (Both Spectral and Color Flow Doppler were utilized during            procedure). Indications:    Atrial fibrillation  History:        Patient has prior history of Echocardiogram examinations, most                 recent 06/01/2019. Risk Factors:Hypertension and Dyslipidemia.  Sonographer:    Tinnie Barefoot RDCS Referring Phys: [id number] ROLLO JONELLE LOUDER IMPRESSIONS  1. Left ventricular ejection fraction, by estimation, is 60 to 65%. The left ventricle has normal function. The left ventricle has no regional wall motion abnormalities. Left ventricular diastolic parameters were normal.  2. Right ventricular systolic function is normal. The right ventricular size is normal.  3. The mitral valve is normal in structure. No evidence of mitral valve regurgitation. No evidence of mitral stenosis.  4. The aortic valve is tricuspid. Aortic valve regurgitation is not visualized. No aortic stenosis is present.  5. The inferior vena cava is normal in size with greater than 50% respiratory variability, suggesting right atrial pressure of 3 mmHg. FINDINGS  Left  Ventricle: Left ventricular ejection fraction, by estimation, is 60 to 65%. The left ventricle has normal function. The left ventricle has no regional wall motion abnormalities. The left ventricular internal cavity size was normal in size. There is  no left ventricular hypertrophy. Left ventricular diastolic parameters were normal. Right Ventricle: The right ventricular size is normal. Right ventricular systolic function is normal. Left Atrium: Left atrial size was normal in size. Right Atrium: Right atrial size was normal in size. Pericardium: There is no evidence of pericardial effusion. Mitral Valve: The mitral valve is normal in structure. No evidence of mitral valve regurgitation. No evidence of mitral valve stenosis. Tricuspid Valve: The tricuspid valve is normal in structure. Tricuspid valve regurgitation is trivial. No evidence of tricuspid stenosis. Aortic Valve: The aortic valve is tricuspid. Aortic valve regurgitation is not visualized. No aortic stenosis is present. Pulmonic Valve: The pulmonic valve was normal in structure. Pulmonic valve regurgitation is not visualized. No evidence of pulmonic stenosis. Aorta: The aortic root is normal in size and structure. Venous: The inferior vena cava is normal in size with greater than 50% respiratory variability, suggesting right atrial pressure of 3 mmHg. IAS/Shunts: No atrial level shunt detected by color flow Doppler.  LEFT VENTRICLE PLAX 2D LVIDd:         4.30 cm     Diastology LVIDs:         3.00 cm     LV e' medial:    9.90 cm/s LV PW:         1.00 cm     LV E/e'  medial:  7.9 LV IVS:        0.90 cm     LV e' lateral:   10.40 cm/s LVOT diam:     1.80 cm     LV E/e' lateral: 7.6 LV SV:         47 LV SV Index:   26 LVOT Area:     2.54 cm LV IVRT:       92 msec  LV Volumes (MOD) LV vol d, MOD A2C: 67.2 ml LV vol d, MOD A4C: 65.6 ml LV vol s, MOD A2C: 29.2 ml LV vol s, MOD A4C: 27.1 ml LV SV MOD A2C:     38.0 ml LV SV MOD A4C:     65.6 ml LV SV MOD BP:       40.5 ml RIGHT VENTRICLE             IVC RV Basal diam:  2.50 cm     IVC diam: 1.90 cm RV S prime:     16.20 cm/s TAPSE (M-mode): 2.4 cm      PULMONARY VEINS                             Diastolic Velocity: 34.60 cm/s                             S/D Velocity:       1.20                             Systolic Velocity:  41.20 cm/s LEFT ATRIUM             Index        RIGHT ATRIUM           Index LA diam:        3.60 cm 1.97 cm/m   RA Area:     15.10 cm LA Vol (A2C):   54.4 ml 29.81 ml/m  RA Volume:   40.00 ml  21.92 ml/m LA Vol (A4C):   48.7 ml 26.69 ml/m LA Biplane Vol: 53.2 ml 29.16 ml/m  AORTIC VALVE LVOT Vmax:   91.70 cm/s LVOT Vmean:  59.200 cm/s LVOT VTI:    0.183 m  AORTA Ao Root diam: 2.90 cm Ao Asc diam:  3.40 cm MITRAL VALVE MV Area (PHT): 3.72 cm    SHUNTS MV Decel Time: 204 msec    Systemic VTI:  0.18 m MV E velocity: 78.60 cm/s  Systemic Diam: 1.80 cm MV A velocity: 77.60 cm/s MV E/A ratio:  1.01 Redell Shallow MD Electronically signed by Redell Shallow MD Signature Date/Time: 04/19/2024/6:04:35 PM    Final    CT CHEST ABDOMEN PELVIS WO CONTRAST Result Date: 04/19/2024 EXAM: CT CHEST, ABDOMEN AND PELVIS WITHOUT CONTRAST 04/19/2024 02:49:25 AM TECHNIQUE: CT of the chest, abdomen and pelvis was performed without the administration of intravenous contrast. Multiplanar reformatted images are provided for review. Automated exposure control, iterative reconstruction, and/or weight based adjustment of the mA/kV was utilized to reduce the radiation dose to as low as reasonably achievable. COMPARISON: None available. CLINICAL HISTORY: abdominal pain, hypotension, kidney transplant previously FINDINGS: CHEST: MEDIASTINUM AND LYMPH NODES: Heart and pericardium are unremarkable. The central airways are clear. No mediastinal, hilar or axillary lymphadenopathy. LUNGS AND PLEURA: No focal consolidation or pulmonary edema. No pleural effusion. No pneumothorax. ABDOMEN AND PELVIS:  LIVER: Unremarkable. GALLBLADDER  AND BILE DUCTS: The gallbladder is distended. No biliary ductal dilatation. SPLEEN: No acute abnormality. PANCREAS: No acute abnormality. ADRENAL GLANDS: No acute abnormality. KIDNEYS, URETERS AND BLADDER: Both native kidneys are severely atrophic. Normal appearance of right lower quadrant transplant kidney. No hydronephrosis. No perinephric or periureteral stranding. Urinary bladder is unremarkable. GI AND BOWEL: Mild inflammatory change adjacent to the splenic flexure of the colon. Stomach demonstrates no acute abnormality. There is no bowel obstruction. REPRODUCTIVE ORGANS: No acute abnormality. PERITONEUM AND RETROPERITONEUM: No ascites. No free air. VASCULATURE: Calcific aortic atherosclerosis. Aorta is normal in caliber. ABDOMINAL AND PELVIS LYMPH NODES: No lymphadenopathy. REPRODUCTIVE ORGANS: No acute abnormality. BONES AND SOFT TISSUES: No acute osseous abnormality. No focal soft tissue abnormality. IMPRESSION: 1. Mild colitis at the splenic flexure of the colon. 2. Distended gallbladder. 3. Normal appearance of the right lower quadrant transplant kidney. Electronically signed by: Franky Stanford MD 04/19/2024 03:35 AM EST RP Workstation: [id number]   CT Head Wo Contrast Result Date: 04/19/2024 EXAM: CT HEAD WITHOUT CONTRAST 04/19/2024 02:49:25 AM TECHNIQUE: CT of the head was performed without the administration of intravenous contrast. Automated exposure control, iterative reconstruction, and/or weight based adjustment of the mA/kV was utilized to reduce the radiation dose to as low as reasonably achievable. COMPARISON: None available. CLINICAL HISTORY: Head trauma, moderate-severe FINDINGS: BRAIN AND VENTRICLES: No acute hemorrhage. No evidence of acute infarct. No hydrocephalus. No extra-axial collection. No mass effect or midline shift. ORBITS: No acute abnormality. SINUSES: No acute abnormality. SOFT TISSUES AND SKULL: No acute soft tissue abnormality. No skull fracture. IMPRESSION: 1. No acute  intracranial abnormality. Electronically signed by: Franky Stanford MD 04/19/2024 03:10 AM EST RP Workstation: [id number]   DG Chest Port 1 View Result Date: 04/19/2024 EXAM: 1 VIEW XRAY OF THE CHEST 04/19/2024 01:48:00 AM COMPARISON: 07/11/2021 CLINICAL HISTORY: Questionable sepsis - evaluate for abnormality FINDINGS: LUNGS AND PLEURA: No focal pulmonary opacity. No pleural effusion. No pneumothorax. HEART AND MEDIASTINUM: No acute abnormality of the cardiac and mediastinal silhouettes. BONES AND SOFT TISSUES: No acute osseous abnormality. IMPRESSION: 1. No acute process. Electronically signed by: Franky Stanford MD 04/19/2024 02:29 AM EST RP Workstation: [id number]    DISCHARGE EXAMINATION: Vitals:   04/23/24 2320 04/24/24 0435 04/24/24 0814 04/24/24 1338  BP: 135/79 (!) 143/80 131/75 121/65  Pulse: (!) 59 61 61   Resp: 17 16 16 14   Temp: 98.3 F (36.8 C) 98.6 F (37 C) 97.9 F (36.6 C) 98.1 F (36.7 C)  TempSrc: Oral Oral Oral Oral  SpO2:  97% 100% 97%  Weight:      Height:       General appearance: Awake alert.  In no distress Resp: Clear to auscultation bilaterally.  Normal effort Cardio: S1-S2 is normal regular.  No S3-S4.  No rubs murmurs or bruit GI: Abdomen is soft.  Nontender nondistended.  Bowel sounds are present normal.  No masses organomegaly Extremities: No edema.  Full range of motion of lower extremities. Neurologic: Alert and oriented x3.  No focal neurological deficits.    DISPOSITION: Home  Discharge Instructions     Call MD for:  difficulty breathing, headache or visual disturbances   Complete by: As directed    Call MD for:  extreme fatigue   Complete by: As directed    Call MD for:  persistant dizziness or light-headedness   Complete by: As directed    Call MD for:  persistant nausea and vomiting   Complete by: As directed  Call MD for:  severe uncontrolled pain   Complete by: As directed    Call MD for:  temperature >100.4   Complete by: As directed     Diet - low sodium heart healthy   Complete by: As directed    Discharge instructions   Complete by: As directed    Cardiology will arrange outpatient follow-up. A sirolimus  level was sent however results will not be back for several days.  This will need to be followed by your kidney team.  You were cared for by a hospitalist during your hospital stay. If you have any questions about your discharge medications or the care you received while you were in the hospital after you are discharged, you can call the unit and asked to speak with the hospitalist on call if the hospitalist that took care of you is not available. Once you are discharged, your primary care physician will handle any further medical issues. Please note that NO REFILLS for any discharge medications will be authorized once you are discharged, as it is imperative that you return to your primary care physician (or establish a relationship with a primary care physician if you do not have one) for your aftercare needs so that they can reassess your need for medications and monitor your lab values. If you do not have a primary care physician, you can call [phone] for a physician referral.   Increase activity slowly   Complete by: As directed          Allergies as of 04/24/2024       Reactions   Hydrocodone-acetaminophen  Itching   Lisinopril Cough        Medication List     STOP taking these medications    aspirin  81 MG chewable tablet   carvedilol  25 MG tablet Commonly known as: COREG    losartan  100 MG tablet Commonly known as: COZAAR        TAKE these medications    acetaminophen  500 MG tablet Commonly known as: TYLENOL  Take 1,000 mg by mouth every 6 (six) hours as needed for moderate pain.   albuterol  108 (90 Base) MCG/ACT inhaler Commonly known as: VENTOLIN  HFA Inhale 2 puffs into the lungs every 6 (six) hours as needed for wheezing or shortness of breath.   amiodarone  200 MG tablet Commonly known  as: PACERONE  Take 2 tablets (400 mg total) by mouth 2 (two) times daily for 7 days, THEN 1 tablet (200 mg total) 2 (two) times daily for 7 days, THEN 1 tablet (200 mg total) daily thereafter Start taking on: April 24, 2024   cefdinir  300 MG capsule Commonly known as: OMNICEF  Take 1 capsule (300 mg total) by mouth 2 (two) times daily for 5 days.   CENTRUM ADULTS PO Take 1 tablet by mouth daily.   cetirizine 10 MG tablet Commonly known as: ZYRTEC Take 10 mg by mouth daily.   diltiazem  180 MG 24 hr capsule Commonly known as: CARDIZEM  CD Take 1 capsule (180 mg total) by mouth daily.   diphenhydrAMINE 25 MG tablet Commonly known as: BENADRYL Take 25 mg by mouth at bedtime as needed for sleep.   Effexor  XR 150 MG 24 hr capsule Generic drug: venlafaxine  XR Take 150 mg by mouth daily with breakfast. Prefers to take at 7:45 AM   Eliquis  5 MG Tabs tablet Generic drug: apixaban  Take 1 tablet (5 mg total) by mouth 2 (two) times daily.   Fish Oil 1000 MG Caps Take 1,000 mg by mouth daily.  guaiFENesin  600 MG 12 hr tablet Commonly known as: MUCINEX  Take 1 tablet (600 mg total) by mouth 2 (two) times daily as needed.   magnesium  oxide 400 (240 Mg) MG tablet Commonly known as: MAG-OX Take 400 mg by mouth at bedtime.   metoprolol  tartrate 50 MG tablet Commonly known as: LOPRESSOR  Take 1 tablet (50 mg total) by mouth 2 (two) times daily.   mycophenolate  500 MG tablet Commonly known as: CELLCEPT  Take 500 mg by mouth 2 (two) times daily. Takes at 7:45 am and pm   omeprazole 20 MG capsule Commonly known as: PRILOSEC Take 20 mg by mouth daily.   predniSONE  5 MG tablet Commonly known as: DELTASONE  Take 5 mg by mouth daily. Prefers to take at 7:45 AM   PROBIOTIC PO Take 1 capsule by mouth daily.   rosuvastatin  5 MG tablet Commonly known as: CRESTOR  Take 5 mg by mouth daily.   Sirolimus  0.5 MG tablet Commonly known as: RAPAMUNE  Take 1 tablet by mouth daily. Prefers to take  at 7:45 AM   Systane Hydration PF 0.4-0.3 % Soln Generic drug: Polyethyl Glyc-Propyl Glyc PF Place 1 drop into both eyes daily as needed (dry eyes).          Follow-up Information     [name], MD. Schedule an appointment as soon as possible for a visit.   Specialty: Family Medicine Why: post hospitalization follow up Contact information: [address] [place] [address] [phone]                 TOTAL DISCHARGE TIME: 35 minutes  Kennidi Yoshida Verdene  [facility] Pager on [email or web address]  04/25/2024, 12:22 PM    "

## 2024-04-25 LAB — MYCOPHENOLIC ACID (CELLCEPT)
MPA Glucuronide: 28 ug/mL — ABNORMAL LOW (ref 35–100)
MPA: 0.8 ug/mL — ABNORMAL LOW (ref 1.0–3.5)

## 2024-04-25 LAB — SIROLIMUS LEVEL: Sirolimus (Rapamycin): 4.9 ng/mL (ref 3.0–20.0)

## 2024-04-26 ENCOUNTER — Ambulatory Visit (INDEPENDENT_AMBULATORY_CARE_PROVIDER_SITE_OTHER): Payer: Self-pay

## 2024-04-26 DIAGNOSIS — H903 Sensorineural hearing loss, bilateral: Secondary | ICD-10-CM

## 2024-04-26 NOTE — Progress Notes (Addendum)
" °  [address] [place], [place] [address] [phone]   Hearing Aid Check        ASSYRIA MORREALE picked up hearing aids from repair.  Patient walked in without a prior appointment schedule.     Right Left  Hearing aid manufacturer [employer] Real 2 miniRITE SN: [id number] replaced New SN:  [id number] [employer] Real 2 miniRITE SN: [id number] replaced New SN: [id number]  Hearing aid style Receiver in the canal Receiver in the canal  Hearing aid technical brewer  Receiver      Dome/ custom earpiece Medium open dome Medium open dome  Retention wire no no  Warranty expiration date 05-14-25 05-14-25  Loss and Damage unknown unknown  Additional accessories Expiration date      Initial fitting date 04-15-22 04-15-22  Device was fit at: [facility]    Chief complaint: Patient here to pick up hearing aids after repair (sent in due to turning off while fully charged, unable to pair to phone)  Manufacturer service Report/Invoice [id number] order date 04/07/2024 total:  $0  Manufacturer indicated that failure of hearing aids/charger could not be determine and thus both aids were replaced and the charger was replaced (old:  [id number] and new serial number [id number]).  Manufacturer reprogrammed both hearings aids to patients settings in which they were received for service.   Because patient was a walk-in without a scheduled appointment, limited time allowed Audiologist to only ensure past hearing aids were deleted in the phone and new aids repaired, and the [employer] app was deleted/reinstalled and hearing paired to the [facility] app.   Pairing was successful and patient indicated satisfaction with sound quality. Patient told that if sound quality needed any adjustments, an appointment will be needed in order to allow dedicated time for services. Patient was given hearing aids, charger, charger cord & block and hearing aid boots.          Services fee: $0 was paid  at checkout. $0 at pick up.     Recommend: Return for a hearing aid check , as needed.  Verifit programming recommended to verify prescription. Return for a hearing evaluation and to see an ENT, if concerns with hearing changes arise.   "

## 2024-05-15 ENCOUNTER — Telehealth: Payer: Self-pay | Admitting: Internal Medicine

## 2024-05-15 NOTE — Telephone Encounter (Signed)
 Pt would like a c/b regarding whether she is able to take Losartan  100 mg that she has on hand due to having elevated BP. Please advise

## 2024-05-16 NOTE — Telephone Encounter (Signed)
 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  Recent BP readings: This morning -- 146/79, HR 71 Last night -- 155/103, HR 101 Sunday night: 158/100, HR 116 (Took meds and drank 2 bottles of water, recheck was 152/85, HR 56)  She is not at home to check her other readings at time of call, but states these are the ranges she has been getting.  She states she does not drink caffeine. She states she has losartan  100 mg tablets available to take if advised.  Losartan  discontinued at discharge from hospital on 04/24/24.  Patient states she has been seeing Dr. Inocencio and saw most recently in hospital. She has not seen Dr. Verlin since 2021. Was last seen by general cardiology 05/17/23.  Will forward to both Dr. Verlin and Dr. Inocencio to review for further advisement.

## 2024-05-16 NOTE — Telephone Encounter (Signed)
 I spoke with patient and gave her message from Dr Inocencio

## 2024-05-18 ENCOUNTER — Other Ambulatory Visit (HOSPITAL_COMMUNITY): Payer: Self-pay

## 2024-05-18 LAB — MISCELLANEOUS TEST

## 2024-05-30 ENCOUNTER — Ambulatory Visit: Admitting: Physician Assistant
